# Patient Record
Sex: Female | Born: 1935 | Race: White | Hispanic: No | Smoking: Never smoker
Health system: Southern US, Community
[De-identification: ages and names within clinical notes are randomized; demographics above are authoritative.]

## PROBLEM LIST (undated history)

## (undated) DIAGNOSIS — B029 Zoster without complications: Secondary | ICD-10-CM

## (undated) DIAGNOSIS — I10 Essential (primary) hypertension: Secondary | ICD-10-CM

## (undated) DIAGNOSIS — E7800 Pure hypercholesterolemia, unspecified: Secondary | ICD-10-CM

## (undated) HISTORY — PX: ABDOMINAL HYSTERECTOMY: SHX81

---

## 2004-02-16 ENCOUNTER — Ambulatory Visit: Payer: Self-pay | Admitting: Internal Medicine

## 2005-03-05 ENCOUNTER — Ambulatory Visit: Payer: Self-pay | Admitting: Internal Medicine

## 2006-03-13 ENCOUNTER — Ambulatory Visit: Payer: Self-pay | Admitting: Internal Medicine

## 2007-03-18 ENCOUNTER — Ambulatory Visit: Payer: Self-pay | Admitting: Internal Medicine

## 2008-03-28 ENCOUNTER — Ambulatory Visit: Payer: Self-pay | Admitting: Internal Medicine

## 2009-04-18 ENCOUNTER — Ambulatory Visit: Payer: Self-pay | Admitting: Internal Medicine

## 2010-04-26 ENCOUNTER — Ambulatory Visit: Payer: Self-pay | Admitting: Internal Medicine

## 2011-07-02 ENCOUNTER — Ambulatory Visit: Payer: Self-pay | Admitting: Family Medicine

## 2011-07-04 ENCOUNTER — Ambulatory Visit: Payer: Self-pay | Admitting: Family Medicine

## 2011-10-28 ENCOUNTER — Observation Stay: Payer: Self-pay | Admitting: Internal Medicine

## 2011-10-28 LAB — URINALYSIS, COMPLETE
Bilirubin,UR: NEGATIVE
Blood: NEGATIVE
Glucose,UR: NEGATIVE mg/dL (ref 0–75)
Hyaline Cast: 1
Leukocyte Esterase: NEGATIVE
Nitrite: NEGATIVE
Ph: 7 (ref 4.5–8.0)
RBC,UR: 1 /HPF (ref 0–5)
Specific Gravity: 1.011 (ref 1.003–1.030)
WBC UR: 2 /HPF (ref 0–5)

## 2011-10-28 LAB — COMPREHENSIVE METABOLIC PANEL
Albumin: 3.8 g/dL (ref 3.4–5.0)
Alkaline Phosphatase: 114 U/L (ref 50–136)
Anion Gap: 7 (ref 7–16)
BUN: 18 mg/dL (ref 7–18)
Bilirubin,Total: 0.4 mg/dL (ref 0.2–1.0)
Creatinine: 0.78 mg/dL (ref 0.60–1.30)
EGFR (African American): >60
EGFR (Non-African Amer.): >60
Glucose: 98 mg/dL (ref 65–99)
SGOT(AST): 29 U/L (ref 15–37)

## 2011-10-28 LAB — CBC
HCT: 38.1 % (ref 35.0–47.0)
HGB: 12.7 g/dL (ref 12.0–16.0)
MCHC: 33.4 g/dL (ref 32.0–36.0)
MCV: 98 fL (ref 80–100)
Platelet: 193 10*3/uL (ref 150–440)
RDW: 12.9 % (ref 11.5–14.5)
WBC: 11 10*3/uL (ref 3.6–11.0)

## 2011-10-28 LAB — CK TOTAL AND CKMB (NOT AT ARMC): CK-MB: 1.2 ng/mL (ref 0.5–3.6)

## 2011-10-29 LAB — BASIC METABOLIC PANEL
Anion Gap: 7 (ref 7–16)
BUN: 18 mg/dL (ref 7–18)
Calcium, Total: 8.6 mg/dL (ref 8.5–10.1)
Chloride: 104 mmol/L (ref 98–107)
Creatinine: 0.8 mg/dL (ref 0.60–1.30)
EGFR (African American): >60
Glucose: 108 mg/dL — ABNORMAL HIGH (ref 65–99)
Osmolality: 286 (ref 275–301)
Potassium: 3.7 mmol/L (ref 3.5–5.1)

## 2011-10-29 LAB — CBC WITH DIFFERENTIAL/PLATELET
Basophil %: 1.2 %
Eosinophil %: 1.8 %
HCT: 34.7 % — ABNORMAL LOW (ref 35.0–47.0)
HGB: 11.6 g/dL — ABNORMAL LOW (ref 12.0–16.0)
Lymphocyte #: 1.8 10*3/uL (ref 1.0–3.6)
MCH: 32.6 pg (ref 26.0–34.0)
MCHC: 33.5 g/dL (ref 32.0–36.0)
Monocyte #: 1 x10 3/mm — ABNORMAL HIGH (ref 0.2–0.9)
Neutrophil %: 62.1 %
RDW: 12.5 % (ref 11.5–14.5)

## 2011-10-29 LAB — CK TOTAL AND CKMB (NOT AT ARMC)
CK, Total: 87 U/L (ref 21–215)
CK-MB: 1.6 ng/mL (ref 0.5–3.6)
CK-MB: 1.1 ng/mL (ref 0.5–3.6)

## 2011-10-29 LAB — TROPONIN I: Troponin-I: <0.02 ng/mL

## 2011-10-30 LAB — BASIC METABOLIC PANEL
Anion Gap: 9 (ref 7–16)
BUN: 13 mg/dL (ref 7–18)
Chloride: 103 mmol/L (ref 98–107)
Co2: 30 mmol/L (ref 21–32)
Creatinine: 0.76 mg/dL (ref 0.60–1.30)
EGFR (African American): >60
Glucose: 96 mg/dL (ref 65–99)
Osmolality: 283 (ref 275–301)
Potassium: 3.5 mmol/L (ref 3.5–5.1)
Sodium: 142 mmol/L (ref 136–145)

## 2011-10-30 LAB — CBC WITH DIFFERENTIAL/PLATELET
Eosinophil #: 0.2 10*3/uL (ref 0.0–0.7)
Lymphocyte #: 1.5 10*3/uL (ref 1.0–3.6)
Monocyte %: 10.4 %
Neutrophil #: 4.3 10*3/uL (ref 1.4–6.5)
Neutrophil %: 64.1 %
Platelet: 166 10*3/uL (ref 150–440)
RBC: 3.64 10*6/uL — ABNORMAL LOW (ref 3.80–5.20)
RDW: 12.7 % (ref 11.5–14.5)
WBC: 6.7 10*3/uL (ref 3.6–11.0)

## 2012-01-02 ENCOUNTER — Ambulatory Visit: Payer: Self-pay | Admitting: Family Medicine

## 2013-01-12 ENCOUNTER — Ambulatory Visit: Payer: Self-pay | Admitting: Family Medicine

## 2013-02-01 ENCOUNTER — Ambulatory Visit: Payer: Self-pay | Admitting: Unknown Physician Specialty

## 2013-02-01 LAB — BASIC METABOLIC PANEL
Anion Gap: 7 (ref 7–16)
BUN: 16 mg/dL (ref 7–18)
Calcium, Total: 9.4 mg/dL (ref 8.5–10.1)
Co2: 30 mmol/L (ref 21–32)
Creatinine: 0.87 mg/dL (ref 0.60–1.30)
EGFR (Non-African Amer.): >60
Glucose: 96 mg/dL (ref 65–99)

## 2014-02-11 ENCOUNTER — Ambulatory Visit: Payer: Self-pay | Admitting: Family Medicine

## 2014-09-04 NOTE — H&P (Signed)
PATIENT NAME:  [name], Dana Bryant DATE OF BIRTH:  09-Apr-1934  DATE OF ADMISSION:  10/28/2011  CHIEF COMPLAINT: Syncope and pain in the left groin and pelvic area.   HISTORY OF PRESENT ILLNESS: Dana Bryant is a 81 year old female with a history of hypertension, hyperlipidemia, and gastroesophageal reflux disease who was brought to the emergency room by family after she passed out at home. The patient states that she had actually been shopping in [employer] earlier today and tripped and fell and sustained an injury to the left pelvic area. The patient subsequently went home and started to feel nauseous and stated the pain got significantly worse. Her husband helped her to sit down and the patient reportedly passed out. The patient's husband states that she was unresponsive for approximately five minutes and subsequently he called 911. On arrival to the ER, the patient was alert and oriented but continued to have pain in the left groin area and pelvic area with difficulty in flexing her left leg and also weight bearing. A CT scan of the pelvis showed a subtle fracture of the left pubic symphysis that was nondisplaced. There was also a soft tissue density noted in the pelvis adjacent to the bladder and the anterior pelvic wall suggesting hematoma. Bladder appeared to be grossly intact.   PAST MEDICAL HISTORY:  1. Hypertension. 2. Stress incontinence. 3. Irritable bowel syndrome. 4. Gastroesophageal reflux disease.  5. Hyperlipidemia.   PAST SURGICAL HISTORY: 1. Hysterectomy.  2. Bilateral foot surgery in 1970s.   CURRENT MEDICATIONS:  1. Lipitor 20 mg once a day.  2. Atenolol 100 mg twice a day. 3. Diovan/HCT 320/12.5 mg one tablet once a day.  4. Aspirin, enteric-coated, 81 mg once a day.  5. Metoclopramide 10 mg daily p.r.n.  6. Calcium with vitamin D 2 tablets by mouth once a day.  7. Multivitamin 1 tablet a day.  8. Prilosec 20 mg 1 capsule a day.   DRUG ALLERGIES:  Famvir and Sulfa.   FAMILY HISTORY: Father is deceased at age 79 from myocardial infarction. Mother alive with cerebrovascular accident.   SOCIAL HISTORY: The patient is married and lives with her husband. She is retired. She denies any use of tobacco. Occasionally she drinks wine.   REVIEW OF SYSTEMS: The patient denies any chest pain or shortness of breath. No fevers, no chills, no dizziness, no history of seizures, no headaches, and no diarrhea.   PHYSICAL EXAMINATION:   VITAL SIGNS: Temperature 96.8, pulse 64, respirations 18, blood pressure 175/87, and pulse oximetry 98% on room air.   GENERAL: She was in mild distress.   HEENT: Normocephalic, atraumatic. Pupils were equal and reactive to light.   NECK: Supple.   LUNGS: Clear to auscultation.   ABDOMEN: Soft, nontender.   HEART: S1, S2.   EXTREMITIES: Tenderness noted of the pubic symphysis with decreased range of motion of the left hip.  NEUROLOGIC: Alert and oriented x3. No obvious focal weakness.   LABORATORY, DIAGNOSTIC AND RADIOLOGIC DATA: EKG showed sinus rhythm with short PR interval of 92 m/sec. Nonspecific ST-T changes noted in the inferior lateral leads.   WBC count 11, hemoglobin 12.7, hematocrit 38.1, and platelets 193. CK 97 and MB 1.2. Glucose 98, BUN 18, creatinine 0.78, sodium 140, potassium 3.5, chloride 102, CO2 31, calcium 9.1, total protein 7.0, and albumin 3.8. Troponin less than 0.02.   CT of the pelvis showed subtle fracture of the symphysis pubis that was nondisplaced. Soft tissue density noted in the  pelvis adjacent to the bladder and anterior pelvic wall suggesting hematoma. Bladder appeared to be grossly intact.   Pelvic x-ray showed no significant osseous abnormalities.   ASSESSMENT AND PLAN: 1Left Pubic symphysis fracture- Non Displaced.                                                  2 HTN                                                  3 Hyperlipidemia                                                   4 GERD The patient will be admitted to [facility] under observation status We will place her on telemetry. We will rule out myocardial infarction with serial cardiac enzymes. Orthopedics, Dr. Hyacinth Meeker, has been consulted. At this point, Dr. Hyacinth Meeker felt that the patient will be managed conservatively with nonoperative intervention. The patient's pain will be managed with Percocet 5/325 mg one tablet p.o. three times daily p.r.n. and morphine as needed. We will also consult physical therapy and will continue home medications including atenolol, Diovan/HCT, and Prilosec. We will also obtain carotid ultrasound to rule out significant carotid stenosis. ___________________________ Barbette Reichmann, MD vh:slb D: 10/28/2011 19:10:59 ET T: 10/29/2011 07:48:16 ET JOB#: [id number]  cc: Barbette Reichmann, MD, <Dictator> Barbette Reichmann MD ELECTRONICALLY SIGNED 11/21/2011 17:16

## 2014-09-04 NOTE — Discharge Summary (Signed)
PATIENT NAME:  Dana Bryant, [name] MR#:  [id number] DATE OF BIRTH:  April 29, 1934  DATE OF ADMISSION:  10/28/2011 DATE OF DISCHARGE:  10/30/2011  DIAGNOSES AT TIME OF DISCHARGE: 1. Fractured symphysis pubis.  2. Syncope.  3. Hypertension.   CHIEF COMPLAINT: Pain left groin and pelvic area after a fall and syncopal event later on.   HISTORY OF PRESENT ILLNESS: Dana Bryant is a 81 year old female with history of hypertension, hyperlipidemia, gastroesophageal reflux disease who presented to the ER brought by family after she passed out. Patient had been shopping at the [employer] garden [place] and tripped and fell sustaining injury to the left pelvic area, subsequently went home and became nauseous and stated the pain got significantly worse. Patient subsequently states that she passed out and was unresponsive approximately five minutes. CAT scan of the pelvis showed a subtle fracture of the left pubic symphysis that was nondisplaced associated with soft tissue density in the pelvis suggesting hematoma.   PAST MEDICAL HISTORY:  1. Hypertension. 2. Stress incontinence. 3. Irritable bowel syndrome. 4. Gastroesophageal reflux disease. 5. Hyperlipidemia.  6. Hysterectomy.  7. Bilateral foot surgery.   PHYSICAL EXAMINATION: GENERAL: She was in moderate degree of pain. VITAL SIGNS: Temperature 96.8, pulse 64, respirations 18, blood pressure 175/87, oxygen saturation 98% on room air. HEENT: Normocephalic, atraumatic. Pupils equal and reactive to light. NECK: Supple. LUNGS: Clear to auscultation. ABDOMEN: Soft nontender. EXTREMITIES: Tenderness noted in the left groin and pubic symphysis area with decreased range of motion of the left hip. NEUROLOGIC: Nonfocal.   LABORATORY, DIAGNOSTIC AND RADIOLOGICAL DATA: WBC count 11, hemoglobin 12.7, hematocrit 38.1, platelets 193, CK 97, MB 1.2, glucose 98, BUN 18, creatinine 0.78, sodium 140, potassium 3.5, chloride 102, CO2 31, calcium 9.1, total protein 7,  albumin 3.8, troponin less than 0.02.   HOSPITAL COURSE: Patient was seen in consultation by Dr. Hyacinth MeekerMiller, orthopedic surgeon, and recommend symptomatic care with ice, rest and pain medications, physical therapy and walker. Patient during her stay in the hospital had episodes of nausea, vomiting and hypertension. She was also seen by physical therapy. Patient declined to go to rehab and preferred to go home. Home health physical therapy has arranged for her.    DISCHARGE MEDICATIONS: She was discharged in stable condition on the following medications. 1. Metoclopramide 10 mg once a day p.r.n.  2. Prilosec 20 mg once a day.  3. Diovan/HCTZ 325/12.5, 1 tablet a day.  4. Atenolol 100 mg a day.  5. Ibuprofen 800 mg p.o. t.i.d. p.r.n. for pain relief.  6. Calcium carbonate 1250 mg 1 tablet p.o. b.i.d.   DISCHARGE INSTRUCTIONS: Patient has been advised a low-sodium diet. Follow up with Dr. Burnadette PopLinthavong in 1 to 2 weeks' time.  ____________________________ Barbette ReichmannVishwanath Delvecchio Madole, MD vh:cms D: 10/30/2011 12:47:00 ET T: 10/30/2011 13:06:59 ET JOB#: [id number]  cc: Barbette ReichmannVishwanath Argelio Granier, MD, <Dictator> Barbette ReichmannVISHWANATH Randle Shatzer MD ELECTRONICALLY SIGNED 11/21/2011 17:16

## 2014-09-04 NOTE — Consult Note (Signed)
Brief Consult Note: Diagnosis: Left pubic ramus fracture.   Patient was seen by consultant.   Recommend further assessment or treatment.   Orders entered.   Comments: 81 year old female [place] [employer] [place] yesterday injuring the left pelvis. Husband helped her home but she fainted and was brought to Emergency Room where ct scan showed fracture and she was admitted.  No complains of pain other than left pelvis.   Exam:  tender anterior left pubis.  circulation/sensation/motor function good lower extremities.  range of motion of hips and knees intact.  back non tender.  skin intact.    X-rays: ct scan shows hairline fracture left pubis.  Rx:  Symptomatic care-- ice, rest, pain meds.   Physical Therapy with walker.  May need skilled nursing facility.          return to  clinic 3-4 weeks for X-rays.  Electronic Signatures: Valinda HoarMiller, Devondre Guzzetta E (MD)  (Signed 18-Jun-13 11:35)  Authored: Brief Consult Note   Last Updated: 18-Jun-13 11:35 by Valinda HoarMiller, Kimmarie Pascale E (MD)

## 2015-03-28 ENCOUNTER — Other Ambulatory Visit: Payer: Self-pay | Admitting: Family Medicine

## 2015-03-28 DIAGNOSIS — Z1231 Encounter for screening mammogram for malignant neoplasm of breast: Secondary | ICD-10-CM

## 2015-04-19 ENCOUNTER — Other Ambulatory Visit: Payer: Self-pay | Admitting: Family Medicine

## 2015-04-19 ENCOUNTER — Ambulatory Visit
Admission: RE | Admit: 2015-04-19 | Discharge: 2015-04-19 | Disposition: A | Discharge status: Home / Self Care | Payer: Medicare PPO | Source: Ambulatory Visit | Attending: Family Medicine | Admitting: Family Medicine

## 2015-04-19 DIAGNOSIS — Z1231 Encounter for screening mammogram for malignant neoplasm of breast: Secondary | ICD-10-CM | POA: Diagnosis present

## 2016-03-12 ENCOUNTER — Other Ambulatory Visit: Payer: Self-pay | Admitting: Family Medicine

## 2016-03-12 DIAGNOSIS — Z1231 Encounter for screening mammogram for malignant neoplasm of breast: Secondary | ICD-10-CM

## 2016-04-23 ENCOUNTER — Ambulatory Visit
Admission: RE | Admit: 2016-04-23 | Discharge: 2016-04-23 | Disposition: A | Discharge status: Home / Self Care | Payer: Medicare PPO | Source: Ambulatory Visit | Attending: Family Medicine | Admitting: Family Medicine

## 2016-04-23 DIAGNOSIS — Z1231 Encounter for screening mammogram for malignant neoplasm of breast: Secondary | ICD-10-CM | POA: Diagnosis present

## 2017-03-19 ENCOUNTER — Other Ambulatory Visit: Payer: Self-pay | Admitting: Family Medicine

## 2017-03-19 DIAGNOSIS — Z1231 Encounter for screening mammogram for malignant neoplasm of breast: Secondary | ICD-10-CM

## 2017-04-28 ENCOUNTER — Ambulatory Visit
Admission: RE | Admit: 2017-04-28 | Discharge: 2017-04-28 | Disposition: A | Discharge status: Home / Self Care | Payer: Medicare PPO | Source: Ambulatory Visit | Attending: Family Medicine | Admitting: Family Medicine

## 2017-04-28 DIAGNOSIS — R928 Other abnormal and inconclusive findings on diagnostic imaging of breast: Secondary | ICD-10-CM | POA: Diagnosis not present

## 2017-04-28 DIAGNOSIS — Z1231 Encounter for screening mammogram for malignant neoplasm of breast: Secondary | ICD-10-CM | POA: Diagnosis not present

## 2017-04-28 DIAGNOSIS — N6489 Other specified disorders of breast: Secondary | ICD-10-CM | POA: Diagnosis not present

## 2017-04-30 ENCOUNTER — Other Ambulatory Visit: Payer: Self-pay | Admitting: Family Medicine

## 2017-04-30 DIAGNOSIS — R928 Other abnormal and inconclusive findings on diagnostic imaging of breast: Secondary | ICD-10-CM

## 2017-04-30 DIAGNOSIS — N6489 Other specified disorders of breast: Secondary | ICD-10-CM

## 2017-05-15 ENCOUNTER — Ambulatory Visit
Admission: RE | Admit: 2017-05-15 | Discharge: 2017-05-15 | Disposition: A | Discharge status: Home / Self Care | Payer: Medicare PPO | Source: Ambulatory Visit | Attending: Family Medicine | Admitting: Family Medicine

## 2017-05-15 DIAGNOSIS — N6489 Other specified disorders of breast: Secondary | ICD-10-CM | POA: Diagnosis present

## 2017-05-15 DIAGNOSIS — R928 Other abnormal and inconclusive findings on diagnostic imaging of breast: Secondary | ICD-10-CM

## 2018-05-04 ENCOUNTER — Other Ambulatory Visit: Payer: Self-pay | Admitting: Family Medicine

## 2018-05-04 DIAGNOSIS — Z1231 Encounter for screening mammogram for malignant neoplasm of breast: Secondary | ICD-10-CM

## 2018-06-02 ENCOUNTER — Ambulatory Visit
Admission: RE | Admit: 2018-06-02 | Discharge: 2018-06-02 | Disposition: A | Discharge status: Home / Self Care | Payer: Medicare PPO | Source: Ambulatory Visit | Attending: Family Medicine | Admitting: Family Medicine

## 2018-06-02 DIAGNOSIS — Z1231 Encounter for screening mammogram for malignant neoplasm of breast: Secondary | ICD-10-CM | POA: Diagnosis not present

## 2019-05-20 ENCOUNTER — Other Ambulatory Visit: Payer: Self-pay | Admitting: Family Medicine

## 2019-05-20 DIAGNOSIS — Z1231 Encounter for screening mammogram for malignant neoplasm of breast: Secondary | ICD-10-CM

## 2019-06-29 ENCOUNTER — Ambulatory Visit
Admission: RE | Admit: 2019-06-29 | Discharge: 2019-06-29 | Disposition: A | Discharge status: Home / Self Care | Payer: Medicare HMO | Source: Ambulatory Visit | Attending: Family Medicine | Admitting: Family Medicine

## 2019-06-29 DIAGNOSIS — Z1231 Encounter for screening mammogram for malignant neoplasm of breast: Secondary | ICD-10-CM | POA: Diagnosis present

## 2019-10-11 ENCOUNTER — Inpatient Hospital Stay
Admission: EM | Admit: 2019-10-11 | Discharge: 2019-10-15 | DRG: 641 | Disposition: A | Discharge status: Skilled Nursing Facility | Payer: Medicare HMO | Attending: Internal Medicine | Admitting: Internal Medicine

## 2019-10-11 ENCOUNTER — Other Ambulatory Visit: Payer: Self-pay

## 2019-10-11 ENCOUNTER — Emergency Department: Payer: Medicare HMO

## 2019-10-11 ENCOUNTER — Encounter: Payer: Self-pay | Admitting: Emergency Medicine

## 2019-10-11 DIAGNOSIS — E871 Hypo-osmolality and hyponatremia: Principal | ICD-10-CM | POA: Diagnosis present

## 2019-10-11 DIAGNOSIS — F419 Anxiety disorder, unspecified: Secondary | ICD-10-CM | POA: Diagnosis present

## 2019-10-11 DIAGNOSIS — E7800 Pure hypercholesterolemia, unspecified: Secondary | ICD-10-CM | POA: Diagnosis present

## 2019-10-11 DIAGNOSIS — Z20822 Contact with and (suspected) exposure to covid-19: Secondary | ICD-10-CM | POA: Diagnosis present

## 2019-10-11 DIAGNOSIS — I10 Essential (primary) hypertension: Secondary | ICD-10-CM | POA: Diagnosis present

## 2019-10-11 DIAGNOSIS — R001 Bradycardia, unspecified: Secondary | ICD-10-CM | POA: Diagnosis present

## 2019-10-11 DIAGNOSIS — E785 Hyperlipidemia, unspecified: Secondary | ICD-10-CM | POA: Diagnosis present

## 2019-10-11 DIAGNOSIS — Z888 Allergy status to other drugs, medicaments and biological substances status: Secondary | ICD-10-CM | POA: Diagnosis not present

## 2019-10-11 DIAGNOSIS — T502X5A Adverse effect of carbonic-anhydrase inhibitors, benzothiadiazides and other diuretics, initial encounter: Secondary | ICD-10-CM | POA: Diagnosis present

## 2019-10-11 DIAGNOSIS — Z882 Allergy status to sulfonamides status: Secondary | ICD-10-CM

## 2019-10-11 DIAGNOSIS — R531 Weakness: Secondary | ICD-10-CM

## 2019-10-11 HISTORY — DX: Essential (primary) hypertension: I10

## 2019-10-11 HISTORY — DX: Zoster without complications: B02.9

## 2019-10-11 HISTORY — DX: Pure hypercholesterolemia, unspecified: E78.00

## 2019-10-11 LAB — OSMOLALITY, URINE: Osmolality, Ur: 364 mOsm/kg (ref 300–900)

## 2019-10-11 LAB — URINALYSIS, COMPLETE (UACMP) WITH MICROSCOPIC
Bacteria, UA: NONE SEEN
Bilirubin Urine: NEGATIVE
Glucose, UA: NEGATIVE mg/dL
Hgb urine dipstick: NEGATIVE
Ketones, ur: NEGATIVE mg/dL
Nitrite: NEGATIVE
Protein, ur: NEGATIVE mg/dL
Specific Gravity, Urine: 1.013 (ref 1.005–1.030)
pH: 7 (ref 5.0–8.0)

## 2019-10-11 LAB — COMPREHENSIVE METABOLIC PANEL
ALT: 22 U/L (ref 0–44)
AST: 30 U/L (ref 15–41)
Albumin: 4 g/dL (ref 3.5–5.0)
Alkaline Phosphatase: 64 U/L (ref 38–126)
Anion gap: 12 (ref 5–15)
BUN: 24 mg/dL — ABNORMAL HIGH (ref 8–23)
CO2: 27 mmol/L (ref 22–32)
Calcium: 9.5 mg/dL (ref 8.9–10.3)
Chloride: 77 mmol/L — ABNORMAL LOW (ref 98–111)
Creatinine, Ser: 0.65 mg/dL (ref 0.44–1.00)
GFR calc Af Amer: >60 mL/min (ref >60)
GFR calc non Af Amer: >60 mL/min (ref >60)
Glucose, Bld: 117 mg/dL — ABNORMAL HIGH (ref 70–99)
Potassium: 3.8 mmol/L (ref 3.5–5.1)
Sodium: 116 mmol/L — CL (ref 135–145)
Total Bilirubin: 1 mg/dL (ref 0.3–1.2)
Total Protein: 7 g/dL (ref 6.5–8.1)

## 2019-10-11 LAB — CORTISOL: Cortisol, Plasma: 21 ug/dL

## 2019-10-11 LAB — OSMOLALITY: Osmolality: 245 mOsm/kg — CL (ref 275–295)

## 2019-10-11 LAB — TSH: TSH: 4.193 u[IU]/mL (ref 0.350–4.500)

## 2019-10-11 LAB — TROPONIN I (HIGH SENSITIVITY)
Troponin I (High Sensitivity): 8 ng/L (ref <18)
Troponin I (High Sensitivity): 9 ng/L (ref <18)

## 2019-10-11 LAB — CBC
HCT: UNDETERMINED % (ref 36.0–46.0)
Hemoglobin: 13.9 g/dL (ref 12.0–15.0)
MCH: UNDETERMINED pg (ref 26.0–34.0)
MCHC: UNDETERMINED g/dL (ref 30.0–36.0)
MCV: UNDETERMINED fL (ref 80.0–100.0)
Platelets: 240 10*3/uL (ref 150–400)
RBC: 4.27 MIL/uL (ref 3.87–5.11)
RDW: 11.5 % (ref 11.5–15.5)
WBC: 8.3 10*3/uL (ref 4.0–10.5)
nRBC: 0 % (ref 0.0–0.2)

## 2019-10-11 LAB — SARS CORONAVIRUS 2 BY RT PCR (HOSPITAL ORDER, PERFORMED IN ~~LOC~~ HOSPITAL LAB): SARS Coronavirus 2: NEGATIVE

## 2019-10-11 LAB — URIC ACID: Uric Acid, Serum: 4.7 mg/dL (ref 2.5–7.1)

## 2019-10-11 LAB — SODIUM, URINE, RANDOM: Sodium, Ur: 21 mmol/L

## 2019-10-11 LAB — CREATININE, URINE, RANDOM: Creatinine, Urine: 88 mg/dL

## 2019-10-11 MED ORDER — ONDANSETRON HCL 4 MG/2ML IJ SOLN: 4.0000 mg | Freq: Four times a day (QID) | INTRAMUSCULAR | Status: DC | PRN

## 2019-10-11 MED ORDER — SODIUM CHLORIDE 0.9 % IV SOLN: INTRAVENOUS | Status: DC

## 2019-10-11 MED ORDER — ONDANSETRON HCL 4 MG PO TABS: 4.0000 mg | ORAL_TABLET | Freq: Four times a day (QID) | ORAL | Status: DC | PRN

## 2019-10-11 MED ORDER — SODIUM CHLORIDE 0.9% FLUSH
3.0000 mL | Freq: Two times a day (BID) | INTRAVENOUS | Status: DC
  Administered 2019-10-11 – 2019-10-12 (×3): 3 mL via INTRAVENOUS

## 2019-10-11 MED ORDER — CALCIUM CARBONATE-VITAMIN D 500-200 MG-UNIT PO TABS
1.0000 | ORAL_TABLET | Freq: Two times a day (BID) | ORAL | Status: DC
  Administered 2019-10-11 – 2019-10-15 (×8): 1 via ORAL
  Filled 2019-10-11 (×8): qty 1

## 2019-10-11 MED ORDER — SODIUM CHLORIDE 0.9 % IV SOLN: 250.0000 mL | INTRAVENOUS | Status: DC | PRN

## 2019-10-11 MED ORDER — ASPIRIN EC 81 MG PO TBEC
81.0000 mg | DELAYED_RELEASE_TABLET | Freq: Every day | ORAL | Status: DC
  Administered 2019-10-11 – 2019-10-15 (×5): 81 mg via ORAL
  Filled 2019-10-11 (×5): qty 1

## 2019-10-11 MED ORDER — ACETAMINOPHEN 650 MG RE SUPP: 650.0000 mg | Freq: Four times a day (QID) | RECTAL | Status: DC | PRN

## 2019-10-11 MED ORDER — PANTOPRAZOLE SODIUM 40 MG PO TBEC
40.0000 mg | DELAYED_RELEASE_TABLET | Freq: Every day | ORAL | Status: DC
  Administered 2019-10-12 – 2019-10-15 (×4): 40 mg via ORAL
  Filled 2019-10-11 (×4): qty 1

## 2019-10-11 MED ORDER — ENOXAPARIN SODIUM 40 MG/0.4ML ~~LOC~~ SOLN
40.0000 mg | SUBCUTANEOUS | Status: DC
  Administered 2019-10-11 – 2019-10-14 (×4): 40 mg via SUBCUTANEOUS
  Filled 2019-10-11 (×4): qty 0.4

## 2019-10-11 MED ORDER — AMLODIPINE BESYLATE 5 MG PO TABS
5.0000 mg | ORAL_TABLET | Freq: Every day | ORAL | Status: DC
  Administered 2019-10-12 – 2019-10-15 (×4): 5 mg via ORAL
  Filled 2019-10-11 (×4): qty 1

## 2019-10-11 MED ORDER — ESCITALOPRAM OXALATE 10 MG PO TABS
5.0000 mg | ORAL_TABLET | Freq: Every day | ORAL | Status: DC
  Administered 2019-10-12: 5 mg via ORAL
  Filled 2019-10-11: qty 0.5

## 2019-10-11 MED ORDER — ATORVASTATIN CALCIUM 20 MG PO TABS
20.0000 mg | ORAL_TABLET | Freq: Every day | ORAL | Status: DC
  Administered 2019-10-11 – 2019-10-15 (×5): 20 mg via ORAL
  Filled 2019-10-11 (×5): qty 1

## 2019-10-11 MED ORDER — SODIUM CHLORIDE 0.9% FLUSH: 3.0000 mL | INTRAVENOUS | Status: DC | PRN

## 2019-10-11 MED ORDER — ACETAMINOPHEN 325 MG PO TABS: 650.0000 mg | ORAL_TABLET | Freq: Four times a day (QID) | ORAL | Status: DC | PRN

## 2019-10-11 MED ORDER — SODIUM CHLORIDE 0.9% FLUSH: 3.0000 mL | Freq: Once | INTRAVENOUS | Status: DC

## 2019-10-11 NOTE — ED Provider Notes (Signed)
[facility] Emergency Department Provider Note   ____________________________________________   First MD Initiated Contact with Patient 10/11/19 1215     (approximate)  I have reviewed the triage vital signs and the nursing notes.   HISTORY  Chief Complaint Weakness    HPI Dana Bryant is a 86 y.o. female with past with history of hypertension and hyperlipidemia who presents to the ED for generalized weakness.  Patient reports that she has been feeling increasingly weak over the past 4 days, typically can ambulate without difficulty but has had to require a walker recently.  She has had associated decreased appetite, fatigue, and decreased urination despite drinking plenty of fluids.  She denies any fevers, chills, chest pain, shortness of breath, dysuria, or hematuria.  She did recently start S-Citalopram for anxiety and symptoms seem to come on shortly after that.        Past Medical History:  Diagnosis Date  . High cholesterol   . Hypertension   . Shingles     There are no problems to display for this patient.   Past Surgical History:  Procedure Laterality Date  . ABDOMINAL HYSTERECTOMY      Prior to Admission medications   Not on File    Allergies Famvir [famciclovir] and Sulfa antibiotics  Family History  Problem Relation Age of Onset  . Ovarian cancer Sister 24  . Breast cancer Neg Hx     Social History Social History   Tobacco Use  . Smoking status: Never Smoker  . Smokeless tobacco: Never Used  Substance Use Topics  . Alcohol use: Not Currently  . Drug use: Not Currently    Review of Systems  Constitutional: No fever/chills.  Positive for fatigue and generalized weakness. Eyes: No visual changes. ENT: No sore throat. Cardiovascular: Denies chest pain. Respiratory: Denies shortness of breath. Gastrointestinal: No abdominal pain.  No nausea, no vomiting.  No diarrhea.  No constipation. Genitourinary:  Negative for dysuria. Musculoskeletal: Negative for back pain. Skin: Negative for rash. Neurological: Negative for headaches, focal weakness or numbness.  ____________________________________________   PHYSICAL EXAM:  VITAL SIGNS: ED Triage Vitals  Enc Vitals Group     BP 10/11/19 1150 (!) 154/56     Pulse Rate 10/11/19 1150 (!) 57     Resp 10/11/19 1150 14     Temp 10/11/19 1150 98.1 F (36.7 C)     Temp Source 10/11/19 1150 Oral     SpO2 10/11/19 1150 95 %     Weight 10/11/19 1154 135 lb (61.2 kg)     Height 10/11/19 1154 5\' 1"  (1.549 m)     Head Circumference --      Peak Flow --      Pain Score 10/11/19 1154 0     Pain Loc --      Pain Edu? --      Excl. in GC? --     Constitutional: Alert and oriented. Eyes: Conjunctivae are normal. Head: Atraumatic. Nose: No congestion/rhinnorhea. Mouth/Throat: Mucous membranes are moist. Neck: Normal ROM Cardiovascular: Normal rate, regular rhythm. Grossly normal heart sounds. Respiratory: Normal respiratory effort.  No retractions. Lungs CTAB. Gastrointestinal: Soft and nontender. No distention. Genitourinary: deferred Musculoskeletal: No lower extremity tenderness nor edema. Neurologic:  Normal speech and language. No gross focal neurologic deficits are appreciated. Skin:  Skin is warm, dry and intact. No rash noted. Psychiatric: Mood and affect are normal. Speech and behavior are normal.  ____________________________________________   LABS (all labs ordered are  listed, but only abnormal results are displayed)  Labs Reviewed  URINALYSIS, COMPLETE (UACMP) WITH MICROSCOPIC - Abnormal; Notable for the following components:      Result Value   Color, Urine YELLOW (*)    APPearance CLOUDY (*)    Leukocytes,Ua SMALL (*)    All other components within normal limits  COMPREHENSIVE METABOLIC PANEL - Abnormal; Notable for the following components:   Sodium 116 (*)    Chloride 77 (*)    Glucose, Bld 117 (*)    BUN 24 (*)     All other components within normal limits  SARS CORONAVIRUS 2 BY RT PCR (HOSPITAL ORDER, [place] [facility])  CBC  CBG MONITORING, ED  TROPONIN I (HIGH SENSITIVITY)  TROPONIN I (HIGH SENSITIVITY)   ____________________________________________  EKG  ED ECG REPORT I, Blake Divine, the attending physician, personally viewed and interpreted this ECG.   Date: 10/11/2019  EKG Time: 11:48  Rate: 58  Rhythm: sinus bradycardia  Axis: Normal  Intervals:Short PR  ST&T Change: T wave changes inferiorly   PROCEDURES  Procedure(s) performed (including Critical Care):  .Critical Care Performed by: Blake Divine, MD Authorized by: Blake Divine, MD   Critical care provider statement:    Critical care time (minutes):  45   Critical care time was exclusive of:  Separately billable procedures and treating other patients and teaching time   Critical care was necessary to treat or prevent imminent or life-threatening deterioration of the following conditions:  Metabolic crisis   Critical care was time spent personally by me on the following activities:  Discussions with consultants, evaluation of patient's response to treatment, examination of patient, ordering and performing treatments and interventions, ordering and review of laboratory studies, ordering and review of radiographic studies, pulse oximetry, re-evaluation of patient's condition, obtaining history from patient or surrogate and review of old charts   I assumed direction of critical care for this patient from another provider in my specialty: no   .1-3 Lead EKG Interpretation Performed by: Blake Divine, MD Authorized by: Blake Divine, MD     Interpretation: abnormal     ECG rate:  58   ECG rate assessment: bradycardic     Rhythm: sinus bradycardia     Ectopy: none     Conduction: normal       ____________________________________________   INITIAL IMPRESSION / ASSESSMENT AND PLAN / ED  COURSE       86 year old female with history of hypertension and hyperlipidemia presents to the ED complaining of increasing generalized weakness over the past 3 to 4 days.  She is ANO x4 and at her neurologic baseline with no focal deficits.  EKG shows sinus bradycardia with T wave changes similar to prior.  There are no apparent infectious symptoms.  Work-up thus far is remarkable for hyponatremia with sodium level of 116.  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  Nephrology recommends starting 1 L fluid restriction for now, will discuss with hospitalist for admission.      ____________________________________________   FINAL CLINICAL IMPRESSION(S) / ED DIAGNOSES  Final diagnoses:  Hyponatremia  Generalized weakness     ED Discharge Orders    None       Note:  This document was prepared using Dragon voice recognition software and may include unintentional dictation errors.   Blake Divine, MD 10/11/19 [id number]

## 2019-10-11 NOTE — H&P (Addendum)
History and Physical    Dana Bryant FGH:[id number] DOB: 01-07-1934 DOA: 10/11/2019  PCP: Dion Body, MD   Patient coming from: Home  I have personally briefly reviewed patient's old medical records in [place]  Chief Complaint: Generalized weakness  HPI: Dana Bryant is a 86 y.o. female with medical history significant for hypertension, depression and dyslipidemia who presents to the emergency room for evaluation of generalized weakness.  Symptoms have been ongoing for about 3 weeks when patient stated that she just felt weak and washed out but was still able to carry out her activities of daily living.  Over the last 4 days she has gotten increasingly weak and required the use of a walker for ambulation.  Patient states she fell 2 days prior to coming to the hospital just because she was very weak.  She has had poor oral intake but denies having any nausea, vomiting, dizziness, lightheadedness or diaphoresis.  She denies having any fever, no chills, no chest pain, no shortness of breath or urinary symptoms. Patient states that she had recent changes toher medications, she had a new antihypertensive medication added and was recently started on citalopram for anxiety symptoms. Labs reveal a serum sodium of 116. Twelve-lead EKG shows sinus bradycardia  ED Course: Patient presents to the emergency room for evaluation of generalized weakness and found to have a serum sodium of 116.  She is also bradycardic.  She will be admitted to the hospital for further evaluation.  Review of Systems: As per HPI otherwise 10 point review of systems negative.    Past Medical History:  Diagnosis Date  . High cholesterol   . Hypertension   . Shingles     Past Surgical History:  Procedure Laterality Date  . ABDOMINAL HYSTERECTOMY       reports that she has never smoked. She has never used smokeless tobacco. She reports previous alcohol use. She reports previous drug  use.  Allergies  Allergen Reactions  . Famvir [Famciclovir] Rash  . Sulfa Antibiotics Swelling and Rash    Family History  Problem Relation Age of Onset  . Ovarian cancer Sister 80  . Breast cancer Neg Hx      Prior to Admission medications   Not on File    Physical Exam: Vitals:   10/11/19 1314 10/11/19 1402 10/11/19 1405 10/11/19 1430  BP:  (!) 182/74    Pulse: 63 62 62 62  Resp:   17 14  Temp:      TempSrc:      SpO2: 98% 95%  97%  Weight:      Height:         Vitals:   10/11/19 1314 10/11/19 1402 10/11/19 1405 10/11/19 1430  BP:  (!) 182/74    Pulse: 63 62 62 62  Resp:   17 14  Temp:      TempSrc:      SpO2: 98% 95%  97%  Weight:      Height:        Constitutional: NAD, alert and oriented x 3 Eyes: PERRL, lids and conjunctivae normal ENMT: Mucous membranes are moist.  Neck: normal, supple, no masses, no thyromegaly Respiratory: clear to auscultation bilaterally, no wheezing, no crackles. Normal respiratory effort. No accessory muscle use.  Cardiovascular: Bradycardia, no murmurs / rubs / gallops. No extremity edema. 2+ pedal pulses. No carotid bruits.  Abdomen: no tenderness, no masses palpated. No hepatosplenomegaly. Bowel sounds positive.  Musculoskeletal: no clubbing / cyanosis. No  joint deformity upper and lower extremities.  Skin: no rashes, lesions, ulcers.  Neurologic: No gross focal neurologic deficit, weakness letter hello this patient's fall Psychiatric: Normal mood and affect.   Labs on Admission: I have personally reviewed following labs and imaging studies  CBC: Recent Labs  Lab 10/11/19 1200  WBC 8.3  HGB 13.9  HCT Unable to determine due to a cold agglutinin  MCV Unable to determine due to a cold agglutinin  PLT 240   Basic Metabolic Panel: Recent Labs  Lab 10/11/19 1200  NA 116*  K 3.8  CL 77*  CO2 27  GLUCOSE 117*  BUN 24*  CREATININE 0.65  CALCIUM 9.5   GFR: Estimated Creatinine Clearance: 44.7 mL/min (by C-G  formula based on SCr of 0.65 mg/dL). Liver Function Tests: Recent Labs  Lab 10/11/19 1200  AST 30  ALT 22  ALKPHOS 64  BILITOT 1.0  PROT 7.0  ALBUMIN 4.0   No results for input(s): LIPASE, AMYLASE in the last 168 hours. No results for input(s): AMMONIA in the last 168 hours. Coagulation Profile: No results for input(s): INR, PROTIME in the last 168 hours. Cardiac Enzymes: No results for input(s): CKTOTAL, CKMB, CKMBINDEX, TROPONINI in the last 168 hours. BNP (last 3 results) No results for input(s): PROBNP in the last 8760 hours. HbA1C: No results for input(s): HGBA1C in the last 72 hours. CBG: No results for input(s): GLUCAP in the last 168 hours. Lipid Profile: No results for input(s): CHOL, HDL, LDLCALC, TRIG, CHOLHDL, LDLDIRECT in the last 72 hours. Thyroid Function Tests: No results for input(s): TSH, T4TOTAL, FREET4, T3FREE, THYROIDAB in the last 72 hours. Anemia Panel: No results for input(s): VITAMINB12, FOLATE, FERRITIN, TIBC, IRON, RETICCTPCT in the last 72 hours. Urine analysis:    Component Value Date/Time   COLORURINE YELLOW (A) 10/11/2019 1200   APPEARANCEUR CLOUDY (A) 10/11/2019 1200   APPEARANCEUR Hazy 10/28/2011 1657   LABSPEC 1.013 10/11/2019 1200   LABSPEC 1.011 10/28/2011 1657   PHURINE 7.0 10/11/2019 1200   GLUCOSEU NEGATIVE 10/11/2019 1200   GLUCOSEU Negative 10/28/2011 1657   HGBUR NEGATIVE 10/11/2019 1200   BILIRUBINUR NEGATIVE 10/11/2019 1200   BILIRUBINUR Negative 10/28/2011 1657   KETONESUR NEGATIVE 10/11/2019 1200   PROTEINUR NEGATIVE 10/11/2019 1200   NITRITE NEGATIVE 10/11/2019 1200   LEUKOCYTESUR SMALL (A) 10/11/2019 1200   LEUKOCYTESUR Negative 10/28/2011 1657    Radiological Exams on Admission: DG Chest Portable 1 View  Result Date: 10/11/2019 CLINICAL DATA:  Weakness EXAM: PORTABLE CHEST 1 VIEW COMPARISON:  None. FINDINGS: Lungs are clear. Heart size and pulmonary vascularity are normal. No adenopathy. There is aortic  atherosclerosis. No bone lesions. There is calcification in the left carotid artery. IMPRESSION: Lungs clear.  Cardiac silhouette normal. Aortic Atherosclerosis (ICD10-I70.0). There is also left carotid artery calcification. Electronically Signed   By: Bretta Bang III M.D.   On: 10/11/2019 13:30    EKG: Independently reviewed.  Sinus bradycardia  Assessment/Plan Principal Problem:   Hyponatremia Active Problems:   Bradycardia   Essential hypertension     Hyponatremia Most likely related to HCTZ use with concomitant administration of SSRI Patient was started on Celexa couple of days ago Will start patient on gentle IVF hydration Obtain urine sodium levels as well as serum osmolality Request nephrology consult Repeat electrolytes in a.m.    Hypertension Hold HCTZ and Atenolol for now due to bradycardia and hyponatremia Continue Amlodipine and Losartan Place patient on hydralazine 10 mg IV for systolic blood pressure greater than     Generalized weakness Appears to be multifactorial and may be related to hyponatremia as well as bradycardia Obtain TSH levels We will request PT evaluation once patient's electrolytes have improved  DVT prophylaxis: Lovenox Code Status: Full Family Communication: Greater than 50% of time was spent discussing plan of care with patient and her son at the bedside, they verbalize understanding and agree with the plan. Code status was discussed and patient is a Full code Disposition Plan: Back to previous home environment Consults called: Nephrology    Lucile Shutters MD [facility]     10/11/2019, 3:19 PM

## 2019-10-11 NOTE — ED Notes (Addendum)
EDP [place] notified of critical sodium of 116 per lab.

## 2019-10-11 NOTE — Consult Note (Signed)
[facility] CONSULT NOTE    Date: 10/11/2019                  Patient Name:  Dana Bryant  MRN: [id number]  DOB: Sep 30, 1933  Age / Sex: 86 y.o., female         PCP: Marisue Ivan, MD                 Service Requesting Consult:  Hospitalist                 Reason for Consult:  Hyponatremia            History of Present Illness: Patient is a 86 y.o. female with a PMHx of hyperlipidemia, hypertension, prior history of herpes zoster infection, who was admitted to [facility] on 10/11/2019 for evaluation of generalized weakness.  She reports that her weakness has been rather progressive in nature.  1 week ago she reports that she started taking Escitalopram.  Her serum sodium now found to be 116.  However in addition she was on Diovan/HCTZ which is known to cause hyponatremia as well.  Her most recent outpatient serum sodium was 142 back in February.  She also reports that she has been drinking a significant amount of water recently.  Her son is also at the bedside today.   Medications: Outpatient medications: (Not in a hospital admission)   Current medications: Current Facility-Administered Medications  Medication Dose Route Frequency Provider Last Rate Last Admin  . 0.9 %  sodium chloride infusion  250 mL Intravenous PRN Agbata, Tochukwu, MD      . 0.9 %  sodium chloride infusion   Intravenous Continuous Reneisha Stilley, MD      . acetaminophen (TYLENOL) tablet 650 mg  650 mg Oral Q6H PRN Agbata, Tochukwu, MD       Or  . acetaminophen (TYLENOL) suppository 650 mg  650 mg Rectal Q6H PRN Agbata, Tochukwu, MD      . enoxaparin (LOVENOX) injection 40 mg  40 mg Subcutaneous Q24H Agbata, Tochukwu, MD      . ondansetron (ZOFRAN) tablet 4 mg  4 mg Oral Q6H PRN Agbata, Tochukwu, MD       Or  . ondansetron (ZOFRAN) injection 4 mg  4 mg Intravenous Q6H PRN Agbata, Tochukwu, MD      . sodium chloride flush (NS) 0.9 % injection 3 mL  3 mL Intravenous Once Willy Eddy, MD      . sodium chloride flush (NS) 0.9 % injection 3 mL  3 mL Intravenous Q12H Agbata, Tochukwu, MD      . sodium chloride flush (NS) 0.9 % injection 3 mL  3 mL Intravenous PRN Agbata, Tochukwu, MD       Current Outpatient Medications  Medication Sig Dispense Refill  . amLODipine (NORVASC) 5 MG tablet Take 5 mg by mouth daily.    Marland Kitchen aspirin 81 MG EC tablet Take 81 mg by mouth daily at 12 noon.    Marland Kitchen atenolol (TENORMIN) 100 MG tablet Take 100 mg by mouth daily.    Marland Kitchen atorvastatin (LIPITOR) 20 MG tablet Take 20 mg by mouth daily.    . Calcium Carbonate-Vitamin D 600-400 MG-UNIT tablet Take 1 tablet by mouth 2 (two) times daily.     Marland Kitchen escitalopram (LEXAPRO) 5 MG tablet Take 5 mg by mouth daily.    Marland Kitchen omeprazole (PRILOSEC) 20 MG capsule Take 20 mg by mouth daily.    . valsartan-hydrochlorothiazide (DIOVAN-HCT) 320-25 MG  tablet Take 1 tablet by mouth daily.    . metoCLOPramide (REGLAN) 10 MG tablet Take 10 mg by mouth daily.        Allergies: Allergies  Allergen Reactions  . Famciclovir Rash  . Sulfa Antibiotics Swelling and Rash      Past Medical History: Past Medical History:  Diagnosis Date  . High cholesterol   . Hypertension   . Shingles      Past Surgical History: Past Surgical History:  Procedure Laterality Date  . ABDOMINAL HYSTERECTOMY       Family History: Family History  Problem Relation Age of Onset  . Ovarian cancer Sister 29  . Breast cancer Neg Hx      Social History: Social History   Socioeconomic History  . Marital status: Widowed    Spouse name: Not on file  . Number of children: Not on file  . Years of education: Not on file  . Highest education level: Not on file  Occupational History  . Not on file  Tobacco Use  . Smoking status: Never Smoker  . Smokeless tobacco: Never Used  Substance and Sexual Activity  . Alcohol use: Not Currently  . Drug use: Not Currently  . Sexual activity: Not on file  Other Topics Concern  . Not on  file  Social History Narrative  . Not on file   Social Determinants of Health   Financial Resource Strain:   . Difficulty of Paying Living Expenses:   Food Insecurity:   . Worried About Charity fundraiser in the Last Year:   . Arboriculturist in the Last Year:   Transportation Needs:   . Film/video editor (Medical):   Marland Kitchen Lack of Transportation (Non-Medical):   Physical Activity:   . Days of Exercise per Week:   . Minutes of Exercise per Session:   Stress:   . Feeling of Stress :   Social Connections:   . Frequency of Communication with Friends and Family:   . Frequency of Social Gatherings with Friends and Family:   . Attends Religious Services:   . Active Member of Clubs or Organizations:   . Attends Archivist Meetings:   Marland Kitchen Marital Status:   Intimate Partner Violence:   . Fear of Current or Ex-Partner:   . Emotionally Abused:   Marland Kitchen Physically Abused:   . Sexually Abused:      Review of Systems: As per HPI  Vital Signs: Blood pressure (!) 182/74, pulse 62, temperature 98.1 F (36.7 C), temperature source Oral, resp. rate 14, height 5\' 1"  (1.549 m), weight 61.2 kg, SpO2 97 %.  Weight trends: Filed Weights   10/11/19 1154  Weight: 61.2 kg    Physical Exam: General: NAD, laying in bed  Head: Normocephalic, atraumatic.  Eyes: Anicteric, EOMI  Nose: Mucous membranes moist, not inflammed, nonerythematous.  Throat: Oropharynx nonerythematous, no exudate appreciated.   Neck: Supple, trachea midline.  Lungs:  Normal respiratory effort. Clear to auscultation BL without crackles or wheezes.  Heart: RRR. S1 and S2 normal without gallop, murmur, or rubs.  Abdomen:  BS normoactive. Soft, Nondistended, non-tender.  No masses or organomegaly.  Extremities: No pretibial edema.  Neurologic: A&O X3, Motor strength is 5/5 in the all 4 extremities  Skin: No visible rashes, scars.    Lab results: Basic Metabolic Panel: Recent Labs  Lab 10/11/19 1200  NA 116*   K 3.8  CL 77*  CO2 27  GLUCOSE 117*  BUN 24*  CREATININE 0.65  CALCIUM 9.5    Liver Function Tests: Recent Labs  Lab 10/11/19 1200  AST 30  ALT 22  ALKPHOS 64  BILITOT 1.0  PROT 7.0  ALBUMIN 4.0   No results for input(s): LIPASE, AMYLASE in the last 168 hours. No results for input(s): AMMONIA in the last 168 hours.  CBC: Recent Labs  Lab 10/11/19 1200  WBC 8.3  HGB 13.9  HCT Unable to determine due to a cold agglutinin  MCV Unable to determine due to a cold agglutinin  PLT 240    Cardiac Enzymes: No results for input(s): CKTOTAL, CKMB, CKMBINDEX, TROPONINI in the last 168 hours.  BNP: Invalid input(s): POCBNP  CBG: No results for input(s): GLUCAP in the last 168 hours.  Microbiology: Results for orders placed or performed during the hospital encounter of 10/11/19  SARS Coronavirus 2 by RT PCR (hospital order, performed in [facility] hospital lab) Nasopharyngeal Nasopharyngeal Swab     Status: None   Collection Time: 10/11/19  1:18 PM   Specimen: Nasopharyngeal Swab  Result Value Ref Range Status   SARS Coronavirus 2 NEGATIVE NEGATIVE Final    Comment: (NOTE) SARS-CoV-2 target nucleic acids are NOT DETECTED. The SARS-CoV-2 RNA is generally detectable in upper and lower respiratory specimens during the acute phase of infection. The lowest concentration of SARS-CoV-2 viral copies this assay can detect is 250 copies / mL. A negative result does not preclude SARS-CoV-2 infection and should not be used as the sole basis for treatment or other patient management decisions.  A negative result may occur with improper specimen collection / handling, submission of specimen other than nasopharyngeal swab, presence of viral mutation(s) within the areas targeted by this assay, and inadequate number of viral copies (<250 copies / mL). A negative result must be combined with clinical observations, patient history, and epidemiological information. Fact Sheet for  Patients:   BoilerBrush.com.cy Fact Sheet for Healthcare Providers: https://pope.com/ This test is not yet approved or cleared  by the [place] FDA and has been authorized for detection and/or diagnosis of SARS-CoV-2 by FDA under an Emergency Use Authorization (EUA).  This EUA will remain in effect (meaning this test can be used) for the duration of the COVID-19 declaration under Section 564(b)(1) of the Act, 21 U.S.C. section 360bbb-3(b)(1), unless the authorization is terminated or revoked sooner. Performed at [facility], [address] [place], [place] [address]     Coagulation Studies: No results for input(s): LABPROT, INR in the last 72 hours.  Urinalysis: Recent Labs    10/11/19 1200  COLORURINE YELLOW*  LABSPEC 1.013  PHURINE 7.0  GLUCOSEU NEGATIVE  HGBUR NEGATIVE  BILIRUBINUR NEGATIVE  KETONESUR NEGATIVE  PROTEINUR NEGATIVE  NITRITE NEGATIVE  LEUKOCYTESUR SMALL*      Imaging: DG Chest Portable 1 View  Result Date: 10/11/2019 CLINICAL DATA:  Weakness EXAM: PORTABLE CHEST 1 VIEW COMPARISON:  None. FINDINGS: Lungs are clear. Heart size and pulmonary vascularity are normal. No adenopathy. There is aortic atherosclerosis. No bone lesions. There is calcification in the left carotid artery. IMPRESSION: Lungs clear.  Cardiac silhouette normal. Aortic Atherosclerosis (ICD10-I70.0). There is also left carotid artery calcification. Electronically Signed   By: Bretta Bang III M.D.   On: 10/11/2019 13:30      Assessment & Plan: Pt is a 86 y.o. female with a PMHx of hyperlipidemia, hypertension, prior history of herpes zoster infection, who was admitted to [facility] on 10/11/2019 for evaluation of generalized weakness.  1.  Hyponatremia.  Patient started on Escitalopram 1 week prior to admission and is also on valsartan/HCTZ at home. 2.  Hypertension.  Plan: Patient appears to have fairly significant  hyponatremia manifested by generalized weakness.  She denies increasing confusion.  Both Escitalopram as well as Diovan/HCTZ could be playing a role.  We will check TSH, cortisol, osmolality, urine osmolality, and serum uric acid level.  We will treat any potential underlying volume depletion with 0.9 normal saline at 75 cc/h.  If this causes lowering of the sodium then this is most likely SIADH.  I have advised the patient to limit her water intake by mouth at this time.  She verbalized understanding.  Target sodium by tomorrow would be 124.  Further plan based upon how patient progresses now.

## 2019-10-11 NOTE — Progress Notes (Signed)
CRITICAL VALUE ALERT  Critical Value:  Serum Blood osmolality - 245  Date & Time Notied:  10/11/2019 1750  Provider Notified: Dr. Joylene Igo  Awaiting orders

## 2019-10-11 NOTE — ED Notes (Addendum)
Pt reports dec appetite, fatigue, dec urination despite inc consumption of beverages. Pt denies burning on urination, new back pain, fever, chills, sweats. Son at bedside "wants to rule out a UTI". Pt was started on new med for anxiety per pt/family. Pt has been on this new med since Tuesday. Family reports pt's symptoms started before starting this new med. Pt's family reports she is eating baseline. History of regular use of ETOH. Pt denies pain. Pt has urine specimen to use when ready to provide sample. Family remains at bedside.

## 2019-10-11 NOTE — ED Notes (Signed)
Pt's family and this RN assisted pt to bedside toilet to attempt to urinate. Pt steady but c/o bilateral leg weakness.

## 2019-10-11 NOTE — ED Triage Notes (Addendum)
Pt presents to ED via POV with her son with c/o generalized weakness x several days. Pt states BLE weakness started on Saturday, was standing at counter and felt like her legs gave out. Pt states concerns for UTI. Pt states fell but denies any injury at this time. Pt A&O x4.    Pt's son reports placed on Escitalopram on Tuesday of last week, reports change in behavior since then.

## 2019-10-12 LAB — CBC
HCT: UNDETERMINED % (ref 36.0–46.0)
Hemoglobin: 12.9 g/dL (ref 12.0–15.0)
MCH: UNDETERMINED pg (ref 26.0–34.0)
MCHC: UNDETERMINED g/dL (ref 30.0–36.0)
MCV: UNDETERMINED fL (ref 80.0–100.0)
Platelets: 203 10*3/uL (ref 150–400)
RBC: 3.88 MIL/uL (ref 3.87–5.11)
RDW: 11.8 % (ref 11.5–15.5)
WBC: 7.4 10*3/uL (ref 4.0–10.5)
nRBC: 0 % (ref 0.0–0.2)

## 2019-10-12 LAB — BASIC METABOLIC PANEL
Anion gap: 11 (ref 5–15)
BUN: 18 mg/dL (ref 8–23)
CO2: 28 mmol/L (ref 22–32)
Calcium: 9.4 mg/dL (ref 8.9–10.3)
Chloride: 82 mmol/L — ABNORMAL LOW (ref 98–111)
Creatinine, Ser: 0.66 mg/dL (ref 0.44–1.00)
GFR calc Af Amer: >60 mL/min (ref >60)
GFR calc non Af Amer: >60 mL/min (ref >60)
Glucose, Bld: 92 mg/dL (ref 70–99)
Potassium: 3.6 mmol/L (ref 3.5–5.1)
Sodium: 121 mmol/L — ABNORMAL LOW (ref 135–145)

## 2019-10-12 MED ORDER — ENSURE ENLIVE PO LIQD
237.0000 mL | Freq: Two times a day (BID) | ORAL | Status: DC
  Administered 2019-10-12 – 2019-10-15 (×6): 237 mL via ORAL

## 2019-10-12 NOTE — Plan of Care (Signed)
  Problem: Education: Goal: Knowledge of General Education information will improve Description: Including pain rating scale, medication(s)/side effects and non-pharmacologic comfort measures Outcome: Progressing   Problem: Clinical Measurements: Goal: Will remain free from infection Outcome: Progressing   Problem: Clinical Measurements: Goal: Cardiovascular complication will be avoided Outcome: Progressing   Problem: Activity: Goal: Risk for activity intolerance will decrease Outcome: Progressing   

## 2019-10-12 NOTE — Progress Notes (Signed)
Initial Nutrition Assessment  DOCUMENTATION CODES:   Not applicable  INTERVENTION:  Continue Ensure Enlive po BID, each supplement provides 350 kcal and 20 grams of protein  NUTRITION DIAGNOSIS:   Inadequate oral intake related to decreased appetite, nausea as evidenced by energy intake < or equal to 50% for > or equal to 5 days, per patient/family report.   GOAL:   Patient will meet greater than or equal to 90% of their needs   MONITOR:   PO intake, Supplement acceptance, Weight trends, Labs, I & O's  REASON FOR ASSESSMENT:   Malnutrition Screening Tool    ASSESSMENT:   86 year old female admitted for hyponatremia after presenting with 4 day history of increasing generalized weakness, poor oral intake and reports a fall at home 2 days prior to coming to hospital secondary to weakness. Past medical history significant for HLD, HTN, and shingles.  Patient awake, alert sitting up in bed talking on the phone at this afternoon at RD visit. She reports eating 1/2 of her [place] sandwich and drinking cranberry juice for lunch today, noted opened Ensure on bedside table. She reports decreased po intake over the past couple of weeks, recalls having significant nausea and only eating crackers drinking soda and lots of water to stay hydrated over the past week. Patient reports normally having a good appetite, reports 3 meals with occasional snacks. She recalls muffin with coffee in the morning, 1/2 sandwich with chips and a cookie for lunch, recalls eating chicken, pork chops, greens, lima beans, and whipped potatoes for dinner, enjoys royal fudge ice cream for dessert. Patient reports occasionally drinking chocolate supplements at home, amenable to chocolate Ensure during admission.   Current wt 131.78 lbs UBW: 135-138 lbs No past weight history available for review, per Care Everywhere pt weighed 134.86 lb on 10/05/19 at [facility]  Medications reviewed and include: Oscal with D, Protonix IVF:  NaCl Labs: Na 121 (L) trending up, Osmolality 245 (L)  NUTRITION - FOCUSED PHYSICAL EXAM: Mild fat depletion to orbital, upper arm regions, moderate fat depletion to buccal region; Mild muscle depletion to temple, clavicle, and dorsal hand regions.  Diet Order:   Diet Order            Diet 2 gram sodium Room service appropriate? Yes; Fluid consistency: Thin  Diet effective now              EDUCATION NEEDS:   No education needs have been identified at this time  Skin:  Skin Assessment: Reviewed RN Assessment  Last BM:  5/31  Height:   Ht Readings from Last 1 Encounters:  10/11/19 5\' 1"  (1.549 m)    Weight:   Wt Readings from Last 1 Encounters:  10/12/19 59.9 kg    BMI:  Body mass index is 24.96 kg/m.  Estimated Nutritional Needs:   Kcal:  1500-1700  Protein:  75-85  Fluid:  >/= 1.5 L/day   12/12/19, RD, LDN Clinical Nutrition After Hours/Weekend Pager # in Amion

## 2019-10-12 NOTE — Progress Notes (Signed)
[facility]  ROUNDING NOTE   Subjective:  Patient seen and evaluated at bedside. Serum sodium up to 121 today. On IV fluid hydration with 0.9 normal saline.   Objective:  Vital signs in last 24 hours:  Temp:  [97.9 F (36.6 C)-98.4 F (36.9 C)] 98.2 F (36.8 C) (06/01 0926) Pulse Rate:  [41-66] 66 (06/01 0926) Resp:  [14-19] 19 (06/01 0926) BP: (130-182)/(47-74) 130/47 (06/01 0926) SpO2:  [95 %-99 %] 97 % (06/01 0926) Weight:  [59.9 kg-61.2 kg] 59.9 kg (06/01 0344)  Weight change:  Filed Weights   10/11/19 1154 10/11/19 1623 10/12/19 0344  Weight: 61.2 kg 60.7 kg 59.9 kg    Intake/Output: I/O last 3 completed shifts: In: 360 [P.O.:360] Out: 200 [Urine:200]   Intake/Output this shift:  Total I/O In: 120 [P.O.:120] Out: -   Physical Exam: General: No acute distress  Head: Normocephalic, atraumatic. Moist oral mucosal membranes  Eyes: Anicteric  Neck: Supple, trachea midline  Lungs:  Clear to auscultation, normal effort  Heart: S1S2 no rubs  Abdomen:  Soft, nontender, bowel sounds present  Extremities: No peripheral edema.  Neurologic: Awake, alert, following commands  Skin: No lesions       Basic Metabolic Panel: Recent Labs  Lab 10/11/19 1200 10/12/19 0534  NA 116* 121*  K 3.8 3.6  CL 77* 82*  CO2 27 28  GLUCOSE 117* 92  BUN 24* 18  CREATININE 0.65 0.66  CALCIUM 9.5 9.4    Liver Function Tests: Recent Labs  Lab 10/11/19 1200  AST 30  ALT 22  ALKPHOS 64  BILITOT 1.0  PROT 7.0  ALBUMIN 4.0   No results for input(s): LIPASE, AMYLASE in the last 168 hours. No results for input(s): AMMONIA in the last 168 hours.  CBC: Recent Labs  Lab 10/11/19 1200 10/12/19 0534  WBC 8.3 7.4  HGB 13.9 12.9  HCT Unable to determine due to a cold agglutinin Unable to determine due to a cold agglutinin  MCV Unable to determine due to a cold agglutinin Unable to determine due to a cold agglutinin  PLT 240 203    Cardiac Enzymes: No  results for input(s): CKTOTAL, CKMB, CKMBINDEX, TROPONINI in the last 168 hours.  BNP: Invalid input(s): POCBNP  CBG: No results for input(s): GLUCAP in the last 168 hours.  Microbiology: Results for orders placed or performed during the hospital encounter of 10/11/19  SARS Coronavirus 2 by RT PCR (hospital order, performed in [facility] hospital lab) Nasopharyngeal Nasopharyngeal Swab     Status: None   Collection Time: 10/11/19  1:18 PM   Specimen: Nasopharyngeal Swab  Result Value Ref Range Status   SARS Coronavirus 2 NEGATIVE NEGATIVE Final    Comment: (NOTE) SARS-CoV-2 target nucleic acids are NOT DETECTED. The SARS-CoV-2 RNA is generally detectable in upper and lower respiratory specimens during the acute phase of infection. The lowest concentration of SARS-CoV-2 viral copies this assay can detect is 250 copies / mL. A negative result does not preclude SARS-CoV-2 infection and should not be used as the sole basis for treatment or other patient management decisions.  A negative result may occur with improper specimen collection / handling, submission of specimen other than nasopharyngeal swab, presence of viral mutation(s) within the areas targeted by this assay, and inadequate number of viral copies (<250 copies / mL). A negative result must be combined with clinical observations, patient history, and epidemiological information. Fact Sheet for Patients:   BoilerBrush.com.cy Fact Sheet for Healthcare Providers: https://pope.com/  This test is not yet approved or cleared  by the [place] and has been authorized for detection and/or diagnosis of SARS-CoV-2 by FDA under an Emergency Use Authorization (EUA).  This EUA will remain in effect (meaning this test can be used) for the duration of the COVID-19 declaration under Section 564(b)(1) of the Act, 21 U.S.C. section 360bbb-3(b)(1), unless the authorization is terminated  or revoked sooner. Performed at [facility], [address] [place], [place] [address]     Coagulation Studies: No results for input(s): LABPROT, INR in the last 72 hours.  Urinalysis: Recent Labs    10/11/19 1200  COLORURINE YELLOW*  LABSPEC 1.013  PHURINE 7.0  GLUCOSEU NEGATIVE  HGBUR NEGATIVE  BILIRUBINUR NEGATIVE  KETONESUR NEGATIVE  PROTEINUR NEGATIVE  NITRITE NEGATIVE  LEUKOCYTESUR SMALL*      Imaging: DG Chest Portable 1 View  Result Date: 10/11/2019 CLINICAL DATA:  Weakness EXAM: PORTABLE CHEST 1 VIEW COMPARISON:  None. FINDINGS: Lungs are clear. Heart size and pulmonary vascularity are normal. No adenopathy. There is aortic atherosclerosis. No bone lesions. There is calcification in the left carotid artery. IMPRESSION: Lungs clear.  Cardiac silhouette normal. Aortic Atherosclerosis (ICD10-I70.0). There is also left carotid artery calcification. Electronically Signed   By: Lowella Grip III M.D.   On: 10/11/2019 13:30     Medications:   . sodium chloride    . sodium chloride     . amLODipine  5 mg Oral Daily  . aspirin EC  81 mg Oral Q1200  . atorvastatin  20 mg Oral Daily  . calcium-vitamin D  1 tablet Oral BID  . enoxaparin (LOVENOX) injection  40 mg Subcutaneous Q24H  . escitalopram  5 mg Oral Daily  . pantoprazole  40 mg Oral Daily  . sodium chloride flush  3 mL Intravenous Once  . sodium chloride flush  3 mL Intravenous Q12H   sodium chloride, acetaminophen **OR** acetaminophen, ondansetron **OR** ondansetron (ZOFRAN) IV, sodium chloride flush  Assessment/ Plan:  86 y.o. female with a PMHx of hyperlipidemia, hypertension, prior history of herpes zoster infection, who was admitted to [facility] on 10/11/2019 for evaluation of generalized weakness.  1.  Hyponatremia.  Patient started on Escitalopram 1 week prior to admission and is also on valsartan/HCTZ at home. 2.  Hypertension.  Plan: As above patient was noted to be on HCTZ and  escitalopram.  Urine sodium therefore not fully evaluated in this case.  However she has responded to IV fluid hydration with 0.9 normal saline which means if there is any some  component of hypovolemia and volume depletion.  Continue 0.9 normal saline for now.  Target sodium by tomorrow would be 129.  In addition will hold escitalopram for now.  Would avoid HCTZ going forward.   LOS: 1 Lyndie Vanderloop 6/1/202111:09 AM

## 2019-10-12 NOTE — Plan of Care (Signed)
°  Problem: Education: °Goal: Knowledge of General Education information will improve °Description: Including pain rating scale, medication(s)/side effects and non-pharmacologic comfort measures °Outcome: Progressing °  °Problem: Clinical Measurements: °Goal: Respiratory complications will improve °Outcome: Progressing °  °Problem: Activity: °Goal: Risk for activity intolerance will decrease °Outcome: Progressing °  °Problem: Safety: °Goal: Ability to remain free from injury will improve °Outcome: Progressing °  °Problem: Skin Integrity: °Goal: Risk for impaired skin integrity will decrease °Outcome: Progressing °  °

## 2019-10-12 NOTE — Progress Notes (Signed)
PROGRESS NOTE    Dana Bryant  HEN:[id number] DOB: 1933/09/13 DOA: 10/11/2019 PCP: Dion Body, MD    Assessment & Plan:   Principal Problem:   Hyponatremia Active Problems:   Bradycardia   Essential hypertension    Dana Bryant is a 86 y.o. Caucasian female with medical history significant for hypertension, depression and dyslipidemia who presents to the emergency room for evaluation of generalized weakness.  Symptoms have been ongoing for about 3 weeks when patient stated that she just felt weak and washed out but was still able to carry out her activities of daily living.     Hyponatremia, POA, improved --Na 116 on presentation, improved to 121 today with IVF. --Most likely related to HCTZ use  --Patient was started on LEXAPRO recently --Nephrology consulted PLAN: --continue MIVF with NS@75  --trend Na, avoid rapid correction --continue to hold LEXAPRO and HCTZ --tele for now  Hypertension Hold Atenolol for now due to bradycardia Hold HCTZ due to hyponatremia Hold Losartan Continue Amlodipine   Generalized weakness Appears to be multifactorial and may be related to hyponatremia as well as bradycardia --PT evaluation once patient's electrolytes have improved   DVT prophylaxis: Lovenox SQ Code Status: Full code  Family Communication:  Status is: inpatient Dispo:   The patient is from: home Anticipated d/c is to: home Anticipated d/c date is: 2-3 days Patient currently is not medically stable to d/c due to: Na still low at 121, on NS IVF, need PT eval.   Subjective and Interval History:  Pt complained of just weakness.  No fever, dyspnea, chest pain, abdominal pain, N/V/D, dysuria.   Objective: Vitals:   10/11/19 2300 10/12/19 0344 10/12/19 0926 10/12/19 1526  BP:  (!) 136/56 (!) 130/47 (!) 117/42  Pulse:  66 66 66  Resp: 14 18 19 19   Temp:  98.3 F (36.8 C) 98.2 F (36.8 C) 98.3 F (36.8 C)  TempSrc:  Oral Oral Oral  SpO2:  97%  97% 96%  Weight:  59.9 kg    Height:        Intake/Output Summary (Last 24 hours) at 10/12/2019 1834 Last data filed at 10/12/2019 1740 Gross per 24 hour  Intake 600 ml  Output 1000 ml  Net -400 ml   Filed Weights   10/11/19 1154 10/11/19 1623 10/12/19 0344  Weight: 61.2 kg 60.7 kg 59.9 kg    Examination:   Constitutional: NAD, AAOx3, sitting up in chair HEENT: conjunctivae and lids normal, EOMI CV: RRR no M,R,G. Distal pulses +2.  No cyanosis.   RESP: CTA B/L, normal respiratory effort  GI: +BS, NTND Extremities: No effusions, edema, or tenderness in BLE SKIN: warm, dry and intact Neuro: II - XII grossly intact.  Sensation intact Psych: Normal mood and affect.  Appropriate judgement and reason   Data Reviewed: I have personally reviewed following labs and imaging studies  CBC: Recent Labs  Lab 10/11/19 1200 10/12/19 0534  WBC 8.3 7.4  HGB 13.9 12.9  HCT Unable to determine due to a cold agglutinin Unable to determine due to a cold agglutinin  MCV Unable to determine due to a cold agglutinin Unable to determine due to a cold agglutinin  PLT 240 361   Basic Metabolic Panel: Recent Labs  Lab 10/11/19 1200 10/12/19 0534  NA 116* 121*  K 3.8 3.6  CL 77* 82*  CO2 27 28  GLUCOSE 117* 92  BUN 24* 18  CREATININE 0.65 0.66  CALCIUM 9.5 9.4   GFR: Estimated Creatinine  Clearance: 44.2 mL/min (by C-G formula based on SCr of 0.66 mg/dL). Liver Function Tests: Recent Labs  Lab 10/11/19 1200  AST 30  ALT 22  ALKPHOS 64  BILITOT 1.0  PROT 7.0  ALBUMIN 4.0   No results for input(s): LIPASE, AMYLASE in the last 168 hours. No results for input(s): AMMONIA in the last 168 hours. Coagulation Profile: No results for input(s): INR, PROTIME in the last 168 hours. Cardiac Enzymes: No results for input(s): CKTOTAL, CKMB, CKMBINDEX, TROPONINI in the last 168 hours. BNP (last 3 results) No results for input(s): PROBNP in the last 8760 hours. HbA1C: No results for  input(s): HGBA1C in the last 72 hours. CBG: No results for input(s): GLUCAP in the last 168 hours. Lipid Profile: No results for input(s): CHOL, HDL, LDLCALC, TRIG, CHOLHDL, LDLDIRECT in the last 72 hours. Thyroid Function Tests: Recent Labs    10/11/19 1632  TSH 4.193   Anemia Panel: No results for input(s): VITAMINB12, FOLATE, FERRITIN, TIBC, IRON, RETICCTPCT in the last 72 hours. Sepsis Labs: No results for input(s): PROCALCITON, LATICACIDVEN in the last 168 hours.  Recent Results (from the past 240 hour(s))  SARS Coronavirus 2 by RT PCR (hospital order, performed in [facility] hospital lab) Nasopharyngeal Nasopharyngeal Swab     Status: None   Collection Time: 10/11/19  1:18 PM   Specimen: Nasopharyngeal Swab  Result Value Ref Range Status   SARS Coronavirus 2 NEGATIVE NEGATIVE Final    Comment: (NOTE) SARS-CoV-2 target nucleic acids are NOT DETECTED. The SARS-CoV-2 RNA is generally detectable in upper and lower respiratory specimens during the acute phase of infection. The lowest concentration of SARS-CoV-2 viral copies this assay can detect is 250 copies / mL. A negative result does not preclude SARS-CoV-2 infection and should not be used as the sole basis for treatment or other patient management decisions.  A negative result may occur with improper specimen collection / handling, submission of specimen other than nasopharyngeal swab, presence of viral mutation(s) within the areas targeted by this assay, and inadequate number of viral copies (<250 copies / mL). A negative result must be combined with clinical observations, patient history, and epidemiological information. Fact Sheet for Patients:   BoilerBrush.com.cy Fact Sheet for Healthcare Providers: https://pope.com/ This test is not yet approved or cleared  by the [place] FDA and has been authorized for detection and/or diagnosis of SARS-CoV-2 by FDA under an  Emergency Use Authorization (EUA).  This EUA will remain in effect (meaning this test can be used) for the duration of the COVID-19 declaration under Section 564(b)(1) of the Act, 21 U.S.C. section 360bbb-3(b)(1), unless the authorization is terminated or revoked sooner. Performed at [facility], [address] [place], [place] [address]       Radiology Studies: DG Chest Portable 1 View  Result Date: 10/11/2019 CLINICAL DATA:  Weakness EXAM: PORTABLE CHEST 1 VIEW COMPARISON:  None. FINDINGS: Lungs are clear. Heart size and pulmonary vascularity are normal. No adenopathy. There is aortic atherosclerosis. No bone lesions. There is calcification in the left carotid artery. IMPRESSION: Lungs clear.  Cardiac silhouette normal. Aortic Atherosclerosis (ICD10-I70.0). There is also left carotid artery calcification. Electronically Signed   By: Bretta Bang III M.D.   On: 10/11/2019 13:30     Scheduled Meds: . amLODipine  5 mg Oral Daily  . aspirin EC  81 mg Oral Q1200  . atorvastatin  20 mg Oral Daily  . calcium-vitamin D  1 tablet Oral BID  . enoxaparin (LOVENOX)  injection  40 mg Subcutaneous Q24H  . feeding supplement (ENSURE ENLIVE)  237 mL Oral BID BM  . pantoprazole  40 mg Oral Daily  . sodium chloride flush  3 mL Intravenous Once  . sodium chloride flush  3 mL Intravenous Q12H   Continuous Infusions: . sodium chloride    . sodium chloride 75 mL/hr at 10/12/19 1156     LOS: 1 day     Darlin Priestly, MD [facility] If 7PM-7AM, please contact night-coverage 10/12/2019, 6:34 PM

## 2019-10-13 LAB — BASIC METABOLIC PANEL
Anion gap: 8 (ref 5–15)
BUN: 16 mg/dL (ref 8–23)
CO2: 28 mmol/L (ref 22–32)
Calcium: 8.6 mg/dL — ABNORMAL LOW (ref 8.9–10.3)
Chloride: 92 mmol/L — ABNORMAL LOW (ref 98–111)
Creatinine, Ser: 0.7 mg/dL (ref 0.44–1.00)
GFR calc Af Amer: >60 mL/min (ref >60)
GFR calc non Af Amer: >60 mL/min (ref >60)
Glucose, Bld: 90 mg/dL (ref 70–99)
Potassium: 3.9 mmol/L (ref 3.5–5.1)
Sodium: 128 mmol/L — ABNORMAL LOW (ref 135–145)

## 2019-10-13 LAB — CBC
HCT: 30 % — ABNORMAL LOW (ref 36.0–46.0)
Hemoglobin: 11 g/dL — ABNORMAL LOW (ref 12.0–15.0)
MCH: 32.5 pg (ref 26.0–34.0)
MCHC: 36.7 g/dL — ABNORMAL HIGH (ref 30.0–36.0)
MCV: 88.8 fL (ref 80.0–100.0)
Platelets: 177 10*3/uL (ref 150–400)
RBC: 3.38 MIL/uL — ABNORMAL LOW (ref 3.87–5.11)
RDW: 11.8 % (ref 11.5–15.5)
WBC: 6.1 10*3/uL (ref 4.0–10.5)
nRBC: 0 % (ref 0.0–0.2)

## 2019-10-13 LAB — MAGNESIUM: Magnesium: 1.7 mg/dL (ref 1.7–2.4)

## 2019-10-13 NOTE — Progress Notes (Signed)
[facility]  ROUNDING NOTE   Subjective:  Patient feeling better today. Serum sodium up to 128. Patient son updated.   Objective:  Vital signs in last 24 hours:  Temp:  [95.4 F (35.2 C)-98.6 F (37 C)] 98.5 F (36.9 C) (06/02 2009) Pulse Rate:  [68-79] 73 (06/02 2009) Resp:  [16-19] 19 (06/02 2009) BP: (105-151)/(38-58) 105/38 (06/02 2009) SpO2:  [95 %-99 %] 98 % (06/02 2009) Weight:  [62.3 kg] 62.3 kg (06/02 0311)  Weight change: 1.043 kg Filed Weights   10/11/19 1623 10/12/19 0344 10/13/19 0311  Weight: 60.7 kg 59.9 kg 62.3 kg    Intake/Output: I/O last 3 completed shifts: In: 2569.2 [P.O.:600; I.V.:1969.2] Out: 2600 [Urine:2600]   Intake/Output this shift:  No intake/output data recorded.  Physical Exam: General: No acute distress  Head: Normocephalic, atraumatic. Moist oral mucosal membranes  Eyes: Anicteric  Neck: Supple, trachea midline  Lungs:  Clear to auscultation, normal effort  Heart: S1S2 no rubs  Abdomen:  Soft, nontender, bowel sounds present  Extremities: No peripheral edema.  Neurologic: Awake, alert, following commands  Skin: No lesions       Basic Metabolic Panel: Recent Labs  Lab 10/11/19 1200 10/12/19 0534 10/13/19 0531  NA 116* 121* 128*  K 3.8 3.6 3.9  CL 77* 82* 92*  CO2 27 28 28   GLUCOSE 117* 92 90  BUN 24* 18 16  CREATININE 0.65 0.66 0.70  CALCIUM 9.5 9.4 8.6*  MG  --   --  1.7    Liver Function Tests: Recent Labs  Lab 10/11/19 1200  AST 30  ALT 22  ALKPHOS 64  BILITOT 1.0  PROT 7.0  ALBUMIN 4.0   No results for input(s): LIPASE, AMYLASE in the last 168 hours. No results for input(s): AMMONIA in the last 168 hours.  CBC: Recent Labs  Lab 10/11/19 1200 10/12/19 0534 10/13/19 0531  WBC 8.3 7.4 6.1  HGB 13.9 12.9 11.0*  HCT Unable to determine due to a cold agglutinin Unable to determine due to a cold agglutinin 30.0*  MCV Unable to determine due to a cold agglutinin Unable to determine due  to a cold agglutinin 88.8  PLT 240 203 177    Cardiac Enzymes: No results for input(s): CKTOTAL, CKMB, CKMBINDEX, TROPONINI in the last 168 hours.  BNP: Invalid input(s): POCBNP  CBG: No results for input(s): GLUCAP in the last 168 hours.  Microbiology: Results for orders placed or performed during the hospital encounter of 10/11/19  SARS Coronavirus 2 by RT PCR (hospital order, performed in [facility] hospital lab) Nasopharyngeal Nasopharyngeal Swab     Status: None   Collection Time: 10/11/19  1:18 PM   Specimen: Nasopharyngeal Swab  Result Value Ref Range Status   SARS Coronavirus 2 NEGATIVE NEGATIVE Final    Comment: (NOTE) SARS-CoV-2 target nucleic acids are NOT DETECTED. The SARS-CoV-2 RNA is generally detectable in upper and lower respiratory specimens during the acute phase of infection. The lowest concentration of SARS-CoV-2 viral copies this assay can detect is 250 copies / mL. A negative result does not preclude SARS-CoV-2 infection and should not be used as the sole basis for treatment or other patient management decisions.  A negative result may occur with improper specimen collection / handling, submission of specimen other than nasopharyngeal swab, presence of viral mutation(s) within the areas targeted by this assay, and inadequate number of viral copies (<250 copies / mL). A negative result must be combined with clinical observations, patient history, and  epidemiological information. Fact Sheet for Patients:   StrictlyIdeas.no Fact Sheet for Healthcare Providers: BankingDealers.co.za This test is not yet approved or cleared  by the [place] FDA and has been authorized for detection and/or diagnosis of SARS-CoV-2 by FDA under an Emergency Use Authorization (EUA).  This EUA will remain in effect (meaning this test can be used) for the duration of the COVID-19 declaration under Section 564(b)(1) of the Act, 21  U.S.C. section 360bbb-3(b)(1), unless the authorization is terminated or revoked sooner. Performed at [facility], [address] [place], [place] [address]     Coagulation Studies: No results for input(s): LABPROT, INR in the last 72 hours.  Urinalysis: Recent Labs    10/11/19 1200  COLORURINE YELLOW*  LABSPEC 1.013  PHURINE 7.0  GLUCOSEU NEGATIVE  HGBUR NEGATIVE  BILIRUBINUR NEGATIVE  KETONESUR NEGATIVE  PROTEINUR NEGATIVE  NITRITE NEGATIVE  LEUKOCYTESUR SMALL*      Imaging: No results found.   Medications:   . sodium chloride    . sodium chloride 75 mL/hr at 10/13/19 1507   . amLODipine  5 mg Oral Daily  . aspirin EC  81 mg Oral Q1200  . atorvastatin  20 mg Oral Daily  . calcium-vitamin D  1 tablet Oral BID  . enoxaparin (LOVENOX) injection  40 mg Subcutaneous Q24H  . feeding supplement (ENSURE ENLIVE)  237 mL Oral BID BM  . pantoprazole  40 mg Oral Daily  . sodium chloride flush  3 mL Intravenous Once  . sodium chloride flush  3 mL Intravenous Q12H   sodium chloride, acetaminophen **OR** acetaminophen, ondansetron **OR** ondansetron (ZOFRAN) IV, sodium chloride flush  Assessment/ Plan:  86 y.o. female with a PMHx of hyperlipidemia, hypertension, prior history of herpes zoster infection, who was admitted to [facility] on 10/11/2019 for evaluation of generalized weakness.  1.  Hyponatremia.  Patient started on Escitalopram 1 week prior to admission and is also on valsartan/HCTZ at home. 2.  Hypertension.  Plan: Sodium continues to trend up and is currently 128.  Continue IV fluid hydration with 0.9 normal saline.  Appropriate rise noted.  Avoid valsartan/HCTZ.  Continue to hold Escitalopram as well.  Further plan as patient progresses.   LOS: 2 Mendel Binsfeld 6/2/20219:23 PM

## 2019-10-13 NOTE — Progress Notes (Signed)
PROGRESS NOTE    Dana Bryant   IZT:[id number]  DOB: [birth year]  PCP: Marisue Ivan, MD    DOA: 10/11/2019 LOS: 2   Brief Narrative   Dana Bryant is a 86 y.o. Caucasian female with medical history significant for hypertension, depression and dyslipidemia who presents to the emergency room for evaluation of generalized weakness.  Symptoms have been ongoing for about 3 weeks when patient stated that she just felt weak and washed out but was still able to carry out her activities of daily living.  Of note, patient reported recent medication changes including a new BP medication and citalopram for depression/anxiety.  Labs were notable for sodium 116 in the ED.  Vitals notable for bradycardia.  Patient admitted to hospitalist service for further evaluation and management.     Assessment & Plan   Principal Problem:   Hyponatremia Active Problems:   Bradycardia   Essential hypertension   Hyponatremia - POA with Na 116, improved with SSRI and HCTZ held, in addition to IV fluids.  Na 128 today.  Most likely secondary to HCTZ more so than SSRI which she had only taken 4 times. --Nephrology following --continue MIVF with NS@75  --trend Na, avoid rapid correction --continue to hold LEXAPRO and HCTZ --telemetry monitoring  Bradycardia - POA.  Takes atenolol for BP. --Hold atenolol --telemetry  Hypertension - chronic, stable.   --Hold Atenolol as above (bradycardia) --Hold ARB/HCTZ combo due to hyponatremia --Continue Amlodipine   Generalized weakness - POA, likely multifactorial due to hyponatremia as well as bradycardia. --PT evaluation  --Home health PT recommended   DVT prophylaxis: Lovenox  Diet:  Diet Orders (From admission, onward)    Start     Ordered   10/11/19 1832  Diet 2 gram sodium Room service appropriate? Yes; Fluid consistency: Thin  Diet effective now    Question Answer Comment  Room service appropriate? Yes   Fluid consistency: Thin      10/11/19 1831            Code Status: Full Code    Subjective 10/13/19    Patient seen at bedside this AM.  No acute events reported.  Patient reports feeling better.  Still has poor appetite.  Denies fever/chills, N/V/D, CP, SOB or other acute complaints.     Disposition Plan & Communication   Status is: Inpatient  Inpatient status remains appropriate because of persistent electrolyte abnormalities requiring close monitoring and IV therapy in hospital.  Dispo: The patient is from: home              Anticipated d/c is to: home              Anticipated d/c date is: 2 days              Patient currently is NOT medically stable for discharge.    Family Communication: none at bedside during encounter, will attempt to call   Consults, Procedures, Significant Events   Consultants:   Nephrology  Procedures:   None  Antimicrobials:   None    Objective   Vitals:   10/13/19 0313 10/13/19 0711 10/13/19 1131 10/13/19 1632  BP: (!) 138/51 (!) 151/58 (!) 119/49 (!) 118/55  Pulse: 73 68 79 73  Resp: 19 18 19 16   Temp: 98 F (36.7 C) 97.6 F (36.4 C) (!) 95.4 F (35.2 C) 98.6 F (37 C)  TempSrc: Oral   Oral  SpO2: 95% 99% 97% 98%  Weight:  Height:        Intake/Output Summary (Last 24 hours) at 10/13/2019 1916 Last data filed at 10/13/2019 1709 Gross per 24 hour  Intake 2089.23 ml  Output 1800 ml  Net 289.23 ml   Filed Weights   10/11/19 1623 10/12/19 0344 10/13/19 0311  Weight: 60.7 kg 59.9 kg 62.3 kg    Physical Exam:  General exam: awake, alert, no acute distress HEENT: moist mucus membranes, hearing grossly normal  Respiratory system: CTAB, no wheezes, rales or rhonchi, normal respiratory effort. Cardiovascular system: normal S1/S2, RRR, no pedal edema.   Central nervous system: A&O x4. no gross focal neurologic deficits, normal speech Extremities: moves all, no edema, normal tone Psychiatry: normal mood, congruent affect, judgement and insight  appear normal  Labs   Data Reviewed: I have personally reviewed following labs and imaging studies  CBC: Recent Labs  Lab 10/11/19 1200 10/12/19 0534 10/13/19 0531  WBC 8.3 7.4 6.1  HGB 13.9 12.9 11.0*  HCT Unable to determine due to a cold agglutinin Unable to determine due to a cold agglutinin 30.0*  MCV Unable to determine due to a cold agglutinin Unable to determine due to a cold agglutinin 88.8  PLT 240 203 416   Basic Metabolic Panel: Recent Labs  Lab 10/11/19 1200 10/12/19 0534 10/13/19 0531  NA 116* 121* 128*  K 3.8 3.6 3.9  CL 77* 82* 92*  CO2 27 28 28   GLUCOSE 117* 92 90  BUN 24* 18 16  CREATININE 0.65 0.66 0.70  CALCIUM 9.5 9.4 8.6*  MG  --   --  1.7   GFR: Estimated Creatinine Clearance: 45.1 mL/min (by C-G formula based on SCr of 0.7 mg/dL). Liver Function Tests: Recent Labs  Lab 10/11/19 1200  AST 30  ALT 22  ALKPHOS 64  BILITOT 1.0  PROT 7.0  ALBUMIN 4.0   No results for input(s): LIPASE, AMYLASE in the last 168 hours. No results for input(s): AMMONIA in the last 168 hours. Coagulation Profile: No results for input(s): INR, PROTIME in the last 168 hours. Cardiac Enzymes: No results for input(s): CKTOTAL, CKMB, CKMBINDEX, TROPONINI in the last 168 hours. BNP (last 3 results) No results for input(s): PROBNP in the last 8760 hours. HbA1C: No results for input(s): HGBA1C in the last 72 hours. CBG: No results for input(s): GLUCAP in the last 168 hours. Lipid Profile: No results for input(s): CHOL, HDL, LDLCALC, TRIG, CHOLHDL, LDLDIRECT in the last 72 hours. Thyroid Function Tests: Recent Labs    10/11/19 1632  TSH 4.193   Anemia Panel: No results for input(s): VITAMINB12, FOLATE, FERRITIN, TIBC, IRON, RETICCTPCT in the last 72 hours. Sepsis Labs: No results for input(s): PROCALCITON, LATICACIDVEN in the last 168 hours.  Recent Results (from the past 240 hour(s))  SARS Coronavirus 2 by RT PCR (hospital order, performed in [facility]  hospital lab) Nasopharyngeal Nasopharyngeal Swab     Status: None   Collection Time: 10/11/19  1:18 PM   Specimen: Nasopharyngeal Swab  Result Value Ref Range Status   SARS Coronavirus 2 NEGATIVE NEGATIVE Final    Comment: (NOTE) SARS-CoV-2 target nucleic acids are NOT DETECTED. The SARS-CoV-2 RNA is generally detectable in upper and lower respiratory specimens during the acute phase of infection. The lowest concentration of SARS-CoV-2 viral copies this assay can detect is 250 copies / mL. A negative result does not preclude SARS-CoV-2 infection and should not be used as the sole basis for treatment or other patient management decisions.  A negative  result may occur with improper specimen collection / handling, submission of specimen other than nasopharyngeal swab, presence of viral mutation(s) within the areas targeted by this assay, and inadequate number of viral copies (<250 copies / mL). A negative result must be combined with clinical observations, patient history, and epidemiological information. Fact Sheet for Patients:   BoilerBrush.com.cy Fact Sheet for Healthcare Providers: https://pope.com/ This test is not yet approved or cleared  by the [place] FDA and has been authorized for detection and/or diagnosis of SARS-CoV-2 by FDA under an Emergency Use Authorization (EUA).  This EUA will remain in effect (meaning this test can be used) for the duration of the COVID-19 declaration under Section 564(b)(1) of the Act, 21 U.S.C. section 360bbb-3(b)(1), unless the authorization is terminated or revoked sooner. Performed at [facility], [address] [place], [place] [address]       Imaging Studies   No results found.   Medications   Scheduled Meds: . amLODipine  5 mg Oral Daily  . aspirin EC  81 mg Oral Q1200  . atorvastatin  20 mg Oral Daily  . calcium-vitamin D  1 tablet Oral BID  . enoxaparin  (LOVENOX) injection  40 mg Subcutaneous Q24H  . feeding supplement (ENSURE ENLIVE)  237 mL Oral BID BM  . pantoprazole  40 mg Oral Daily  . sodium chloride flush  3 mL Intravenous Once  . sodium chloride flush  3 mL Intravenous Q12H   Continuous Infusions: . sodium chloride    . sodium chloride 75 mL/hr at 10/13/19 1507       LOS: 2 days    Time spent: 30 minutes    Pennie Banter, DO [facility]  10/13/2019, 7:16 PM    If 7PM-7AM, please contact night-coverage. How to contact the [facility] Attending or Consulting provider 7A - 7P or covering provider during after hours 7P -7A, for this patient?    1. Check the care team in [facility] and look for a) attending/consulting [facility] provider listed and b) the [facility] team listed 2. Log into [email or web address] and use [place]'s universal password to access. If you do not have the password, please contact the hospital operator. 3. Locate the [facility] provider you are looking for under [facility] and page to a number that you can be directly reached. 4. If you still have difficulty reaching the provider, please page the [facility] (Director on Call) for the Hospitalists listed on amion for assistance.

## 2019-10-13 NOTE — Hospital Course (Signed)
Dana Bryant is a 86 y.o. Caucasian female with medical history significant for hypertension, depression and dyslipidemia who presents to the emergency room for evaluation of generalized weakness.  Symptoms have been ongoing for about 3 weeks when patient stated that she just felt weak and washed out but was still able to carry out her activities of daily living.  Of note, patient reported recent medication changes including a new BP medication and citalopram for depression/anxiety.  Labs were notable for sodium 116 in the ED.  Vitals notable for bradycardia.  Patient admitted to hospitalist service for further evaluation and management.

## 2019-10-13 NOTE — Evaluation (Signed)
Physical Therapy Evaluation Patient Details Name: Dana Bryant MRN: [id number] DOB: 03/01/1934 Today's Date: 10/13/2019   History of Present Illness  Dana Bryant is a 86 y.o. female with medical history significant for hypertension, depression and dyslipidemia who presents to the emergency room for evaluation of generalized weakness.  Symptoms have been ongoing for about 3 weeks when patient stated that she just felt weak and washed out but was still able to carry out her activities of daily living.  Over the last 4 days she has gotten increasingly weak and required the use of a walker for ambulation.  Patient states she fell 2 days prior to coming to the hospital just because she was very weak.  Clinical Impression  Patient reports she is feeling better than yesterday. She is agreeable to PT assessment. Patient performed bed mobility with modified independence, transfers with min assist and ambulated 25 feet with min assist and rw. She demonstrates decreased activity tolerance and weakness. She will continue to benefit from skilled PT while here to improve strength and tolerance for safe return home at discharge.     Follow Up Recommendations Home health PT;Supervision for mobility/OOB    Equipment Recommendations  None recommended by PT    Recommendations for Other Services       Precautions / Restrictions Precautions Precautions: Fall Restrictions Weight Bearing Restrictions: No      Mobility  Bed Mobility Overal bed mobility: Modified Independent             General bed mobility comments: use of rails, increased time  Transfers Overall transfer level: Needs assistance Equipment used: None Transfers: Sit to/from Stand Sit to Stand: Min guard            Ambulation/Gait Ambulation/Gait assistance: Min guard Gait Distance (Feet): 25 Feet Assistive device: Rolling walker (2 wheeled) Gait Pattern/deviations: Step-through pattern;Decreased stride  length Gait velocity: decr   General Gait Details: generally steady, but weak with ambulation.  Stairs            Wheelchair Mobility    Modified Rankin (Stroke Patients Only)       Balance Overall balance assessment: Needs assistance Sitting-balance support: Feet supported Sitting balance-Leahy Scale: Fair     Standing balance support: Bilateral upper extremity supported;During functional activity Standing balance-Leahy Scale: Good Standing balance comment: benefits from RW at this time                             Pertinent Vitals/Pain Pain Assessment: No/denies pain    Home Living Family/patient expects to be discharged to:: Private residence Living Arrangements: Alone Available Help at Discharge: Family;Available PRN/intermittently Type of Home: House Home Access: Stairs to enter   [employer] of Steps: flight Home Layout: Two level;Able to live on main level with bedroom/bathroom Home Equipment: Walker - 2 wheels      Prior Function Level of Independence: Independent         Comments: was walking without device, driving     Hand Dominance        Extremity/Trunk Assessment   Upper Extremity Assessment Upper Extremity Assessment: Generalized weakness    Lower Extremity Assessment Lower Extremity Assessment: Generalized weakness    Cervical / Trunk Assessment Cervical / Trunk Assessment: Normal  Communication   Communication: No difficulties  Cognition Arousal/Alertness: Awake/alert Behavior During Therapy: WFL for tasks assessed/performed Overall Cognitive Status: Within Functional Limits for tasks assessed  General Comments      Exercises     Assessment/Plan    PT Assessment Patient needs continued PT services  PT Problem List Decreased strength;Decreased mobility;Decreased activity tolerance;Decreased balance;Decreased knowledge of use of DME        PT Treatment Interventions DME instruction;Therapeutic exercise;Gait training;Balance training;Stair training;Neuromuscular re-education;Functional mobility training;Therapeutic activities;Patient/family education    PT Goals (Current goals can be found in the Care Plan section)  Acute Rehab PT Goals Patient Stated Goal: to return home at discharge, get stronger PT Goal Formulation: With patient Time For Goal Achievement: 10/27/19 Potential to Achieve Goals: Fair    Frequency Min 2X/week   Barriers to discharge Inaccessible home environment;Decreased caregiver support family may be able to stay with her if necessary, otherwise they are available intermittently.    Co-evaluation               AM-PAC PT "6 Clicks" Mobility  Outcome Measure Help needed turning from your back to your side while in a flat bed without using bedrails?: A Little Help needed moving from lying on your back to sitting on the side of a flat bed without using bedrails?: A Little Help needed moving to and from a bed to a chair (including a wheelchair)?: A Little Help needed standing up from a chair using your arms (e.g., wheelchair or bedside chair)?: A Little Help needed to walk in hospital room?: A Little Help needed climbing 3-5 steps with a railing? : A Lot 6 Click Score: 17    End of Session Equipment Utilized During Treatment: Gait belt Activity Tolerance: Patient limited by fatigue Patient left: in bed;with bed alarm set;with call bell/phone within reach;with family/visitor present Nurse Communication: Mobility status PT Visit Diagnosis: Muscle weakness (generalized) (M62.81);Difficulty in walking, not elsewhere classified (R26.2)    Time: 5916-3846 PT Time Calculation (min) (ACUTE ONLY): 18 min   Charges:   PT Evaluation $PT Eval Moderate Complexity: 1 Mod PT Treatments $Gait Training: 8-22 mins        Navada Osterhout, PT, GCS 10/13/19,3:56 PM

## 2019-10-14 LAB — CBC
HCT: 29.4 % — ABNORMAL LOW (ref 36.0–46.0)
Hemoglobin: 10.5 g/dL — ABNORMAL LOW (ref 12.0–15.0)
MCH: 32.5 pg (ref 26.0–34.0)
MCHC: 35.7 g/dL (ref 30.0–36.0)
MCV: 91 fL (ref 80.0–100.0)
Platelets: 178 10*3/uL (ref 150–400)
RBC: 3.23 MIL/uL — ABNORMAL LOW (ref 3.87–5.11)
RDW: 12 % (ref 11.5–15.5)
WBC: 5.2 10*3/uL (ref 4.0–10.5)
nRBC: 0 % (ref 0.0–0.2)

## 2019-10-14 LAB — BASIC METABOLIC PANEL
Anion gap: 9 (ref 5–15)
BUN: 22 mg/dL (ref 8–23)
CO2: 28 mmol/L (ref 22–32)
Calcium: 9.1 mg/dL (ref 8.9–10.3)
Chloride: 95 mmol/L — ABNORMAL LOW (ref 98–111)
Creatinine, Ser: 0.67 mg/dL (ref 0.44–1.00)
GFR calc Af Amer: >60 mL/min (ref >60)
GFR calc non Af Amer: >60 mL/min (ref >60)
Glucose, Bld: 98 mg/dL (ref 70–99)
Potassium: 4.2 mmol/L (ref 3.5–5.1)
Sodium: 132 mmol/L — ABNORMAL LOW (ref 135–145)

## 2019-10-14 LAB — MAGNESIUM: Magnesium: 1.7 mg/dL (ref 1.7–2.4)

## 2019-10-14 MED ORDER — POLYETHYLENE GLYCOL 3350 17 G PO PACK: 17.0000 g | PACK | Freq: Every day | ORAL | Status: DC

## 2019-10-14 NOTE — Progress Notes (Signed)
[facility]  ROUNDING NOTE   Subjective:  Serum sodium currently 132. Patient feeling better. She will work with physical therapy today.   Objective:  Vital signs in last 24 hours:  Temp:  [98.5 F (36.9 C)-98.7 F (37.1 C)] 98.7 F (37.1 C) (06/03 0736) Pulse Rate:  [73-96] 96 (06/03 1205) Resp:  [16-20] 18 (06/03 1205) BP: (105-145)/(38-68) 144/48 (06/03 1205) SpO2:  [96 %-98 %] 98 % (06/03 1205) Weight:  [63.1 kg] 63.1 kg (06/03 0330)  Weight change: 0.862 kg Filed Weights   10/12/19 0344 10/13/19 0311 10/14/19 0330  Weight: 59.9 kg 62.3 kg 63.1 kg    Intake/Output: I/O last 3 completed shifts: In: 3764.5 [P.O.:120; I.V.:3644.5] Out: 2500 [Urine:2500]   Intake/Output this shift:  Total I/O In: 240 [P.O.:240] Out: 1300 [Urine:1300]  Physical Exam: General: No acute distress  Head: Normocephalic, atraumatic. Moist oral mucosal membranes  Eyes: Anicteric  Neck: Supple, trachea midline  Lungs:  Clear to auscultation, normal effort  Heart: S1S2 no rubs  Abdomen:  Soft, nontender, bowel sounds present  Extremities: No peripheral edema.  Neurologic: Awake, alert, following commands  Skin: No lesions       Basic Metabolic Panel: Recent Labs  Lab 10/11/19 1200 10/11/19 1200 10/12/19 0534 10/13/19 0531 10/14/19 0457  NA 116*  --  121* 128* 132*  K 3.8  --  3.6 3.9 4.2  CL 77*  --  82* 92* 95*  CO2 27  --  28 28 28   GLUCOSE 117*  --  92 90 98  BUN 24*  --  18 16 22   CREATININE 0.65  --  0.66 0.70 0.67  CALCIUM 9.5   < > 9.4 8.6* 9.1  MG  --   --   --  1.7 1.7   < > = values in this interval not displayed.    Liver Function Tests: Recent Labs  Lab 10/11/19 1200  AST 30  ALT 22  ALKPHOS 64  BILITOT 1.0  PROT 7.0  ALBUMIN 4.0   No results for input(s): LIPASE, AMYLASE in the last 168 hours. No results for input(s): AMMONIA in the last 168 hours.  CBC: Recent Labs  Lab 10/11/19 1200 10/12/19 0534 10/13/19 0531 10/14/19 0457   WBC 8.3 7.4 6.1 5.2  HGB 13.9 12.9 11.0* 10.5*  HCT Unable to determine due to a cold agglutinin Unable to determine due to a cold agglutinin 30.0* 29.4*  MCV Unable to determine due to a cold agglutinin Unable to determine due to a cold agglutinin 88.8 91.0  PLT 240 203 177 178    Cardiac Enzymes: No results for input(s): CKTOTAL, CKMB, CKMBINDEX, TROPONINI in the last 168 hours.  BNP: Invalid input(s): POCBNP  CBG: No results for input(s): GLUCAP in the last 168 hours.  Microbiology: Results for orders placed or performed during the hospital encounter of 10/11/19  SARS Coronavirus 2 by RT PCR (hospital order, performed in [facility] hospital lab) Nasopharyngeal Nasopharyngeal Swab     Status: None   Collection Time: 10/11/19  1:18 PM   Specimen: Nasopharyngeal Swab  Result Value Ref Range Status   SARS Coronavirus 2 NEGATIVE NEGATIVE Final    Comment: (NOTE) SARS-CoV-2 target nucleic acids are NOT DETECTED. The SARS-CoV-2 RNA is generally detectable in upper and lower respiratory specimens during the acute phase of infection. The lowest concentration of SARS-CoV-2 viral copies this assay can detect is 250 copies / mL. A negative result does not preclude SARS-CoV-2 infection and should not  be used as the sole basis for treatment or other patient management decisions.  A negative result may occur with improper specimen collection / handling, submission of specimen other than nasopharyngeal swab, presence of viral mutation(s) within the areas targeted by this assay, and inadequate number of viral copies (<250 copies / mL). A negative result must be combined with clinical observations, patient history, and epidemiological information. Fact Sheet for Patients:   StrictlyIdeas.no Fact Sheet for Healthcare Providers: BankingDealers.co.za This test is not yet approved or cleared  by the [place] FDA and has been authorized for  detection and/or diagnosis of SARS-CoV-2 by FDA under an Emergency Use Authorization (EUA).  This EUA will remain in effect (meaning this test can be used) for the duration of the COVID-19 declaration under Section 564(b)(1) of the Act, 21 U.S.C. section 360bbb-3(b)(1), unless the authorization is terminated or revoked sooner. Performed at [facility], [address] [place], [place] [address]     Coagulation Studies: No results for input(s): LABPROT, INR in the last 72 hours.  Urinalysis: No results for input(s): COLORURINE, LABSPEC, PHURINE, GLUCOSEU, HGBUR, BILIRUBINUR, KETONESUR, PROTEINUR, UROBILINOGEN, NITRITE, LEUKOCYTESUR in the last 72 hours.  Invalid input(s): APPERANCEUR    Imaging: No results found.   Medications:   . sodium chloride    . sodium chloride 75 mL/hr at 10/14/19 0332   . amLODipine  5 mg Oral Daily  . aspirin EC  81 mg Oral Q1200  . atorvastatin  20 mg Oral Daily  . calcium-vitamin D  1 tablet Oral BID  . enoxaparin (LOVENOX) injection  40 mg Subcutaneous Q24H  . feeding supplement (ENSURE ENLIVE)  237 mL Oral BID BM  . pantoprazole  40 mg Oral Daily  . polyethylene glycol  17 g Oral Daily  . sodium chloride flush  3 mL Intravenous Once  . sodium chloride flush  3 mL Intravenous Q12H   sodium chloride, acetaminophen **OR** acetaminophen, ondansetron **OR** ondansetron (ZOFRAN) IV, sodium chloride flush  Assessment/ Plan:  86 y.o. female with a PMHx of hyperlipidemia, hypertension, prior history of herpes zoster infection, who was admitted to [facility] on 10/11/2019 for evaluation of generalized weakness.  1.  Hyponatremia.  Patient started on Escitalopram 1 week prior to admission and is also on valsartan/HCTZ at home. 2.  Hypertension.  Plan: Serum sodium continues to rise and currently up to 132.  Continue 0 point normal saline at 75 cc/h for 1 additional day.  Continue to avoid Diovan/HCTZ as well as escitalopram.  Okay to continue  amlodipine.  Agree with ongoing physical therapy evaluation and assessment.  Possible discharge tomorrow.   LOS: 3 Massa Pe 6/3/20212:21 PM

## 2019-10-14 NOTE — Progress Notes (Signed)
Pt ambulated with RN to nursing station using walker/ tolerated well

## 2019-10-14 NOTE — Care Management Important Message (Signed)
Important Message  Patient Details  Name: AIZLYNN DIGILIO MRN: [id number] Date of Birth: [birth year]   Medicare Important Message Given:  Yes     Johnell Comings 10/14/2019, 2:54 PM

## 2019-10-14 NOTE — Progress Notes (Signed)
PROGRESS NOTE    Dana Bryant   ZYS:[id number]  DOB: 24-Oct-1933  PCP: Dion Body, MD    DOA: 10/11/2019 LOS: 3   Brief Narrative   Dana Bryant is a 86 y.o. Caucasian female with medical history significant for hypertension, depression and dyslipidemia who presents to the emergency room for evaluation of generalized weakness.  Symptoms have been ongoing for about 3 weeks when patient stated that she just felt weak and washed out but was still able to carry out her activities of daily living.  Of note, patient reported recent medication changes including a new BP medication and citalopram for depression/anxiety.  Labs were notable for sodium 116 in the ED.  Vitals notable for bradycardia.  Patient admitted to hospitalist service for further evaluation and management.     Assessment & Plan   Principal Problem:   Hyponatremia Active Problems:   Bradycardia   Essential hypertension   Hyponatremia - POA with Na 116, improved with SSRI and HCTZ held, in addition to IV fluids.  Na 132 today.  Most likely secondary to HCTZ more so than SSRI which she had only taken 4 times. --Nephrology following --continue IVF with NS@75  one more day --BMP in AM --continue to hold LEXAPRO and Diovan/HCTZ --telemetry monitoring  Bradycardia - POA.  Takes atenolol for BP. --Hold atenolol --telemetry  Hypertension - chronic, stable.   --Hold Atenolol as above (bradycardia) --Hold ARB/HCTZ combo due to hyponatremia --Continue Amlodipine   Generalized weakness - POA, likely multifactorial due to hyponatremia as well as bradycardia. Patient and son concerned about patient's safety with mobility at home. --PT evaluation  --Home health PT recommended --Ambulate patient every 4 hours to prevent further weakening   DVT prophylaxis: Lovenox  Diet:  Diet Orders (From admission, onward)    Start     Ordered   10/11/19 1832  Diet 2 gram sodium Room service appropriate? Yes;  Fluid consistency: Thin  Diet effective now    Question Answer Comment  Room service appropriate? Yes   Fluid consistency: Thin      10/11/19 1831            Code Status: Full Code    Subjective 10/14/19    Patient seen up in chair eating breakfast this AM.  No acute events reported.  She reports feeling much better.  Asks if she can stay tomorrow if still feeling too weak or unsteady.  Son expressed same concern, want patient to be "steady on her feet" to go home.  Requested PT see again.  Patient denies acute complaints.    Disposition Plan & Communication   Status is: Inpatient  Inpatient status remains appropriate because of persistent electrolyte abnormalities requiring close monitoring and IV therapy in hospital.  Dispo: The patient is from: home              Anticipated d/c is to: home              Anticipated d/c date is: 10/15/19              Patient currently is NOT medically stable for discharge.    Family Communication: son updated this morning along with nephrologist.  Agreeable with plan to d/c home tomorrow, but he asks that PT see patient and make sure she is steady on her feet.   Consults, Procedures, Significant Events   Consultants:   Nephrology  Procedures:   None  Antimicrobials:   None    Objective  Vitals:   10/14/19 0330 10/14/19 0332 10/14/19 0736 10/14/19 1205  BP:  (!) 145/54 (!) 144/68 (!) 144/48  Pulse:  82 82 96  Resp:  20 20 18   Temp:  98.6 F (37 C) 98.7 F (37.1 C)   TempSrc:  Oral Oral   SpO2:  97% 96% 98%  Weight: 63.1 kg     Height:        Intake/Output Summary (Last 24 hours) at 10/14/2019 1500 Last data filed at 10/14/2019 1408 Gross per 24 hour  Intake 1904.05 ml  Output 2600 ml  Net -695.95 ml   Filed Weights   10/12/19 0344 10/13/19 0311 10/14/19 0330  Weight: 59.9 kg 62.3 kg 63.1 kg    Physical Exam:  General exam: awake, alert, no acute distress HEENT: moist mucus membranes, hearing grossly normal    Respiratory system: CTAB, normal respiratory effort. Cardiovascular system: normal S1/S2, RRR, no pedal edema.   Central nervous system: A&O x4. no gross focal neurologic deficits, normal speech Extremities: moves all, no edema, normal tone Psychiatry: normal mood, congruent affect  Labs   Data Reviewed: I have personally reviewed following labs and imaging studies  CBC: Recent Labs  Lab 10/11/19 1200 10/12/19 0534 10/13/19 0531 10/14/19 0457  WBC 8.3 7.4 6.1 5.2  HGB 13.9 12.9 11.0* 10.5*  HCT Unable to determine due to a cold agglutinin Unable to determine due to a cold agglutinin 30.0* 29.4*  MCV Unable to determine due to a cold agglutinin Unable to determine due to a cold agglutinin 88.8 91.0  PLT 240 203 177 178   Basic Metabolic Panel: Recent Labs  Lab 10/11/19 1200 10/12/19 0534 10/13/19 0531 10/14/19 0457  NA 116* 121* 128* 132*  K 3.8 3.6 3.9 4.2  CL 77* 82* 92* 95*  CO2 27 28 28 28   GLUCOSE 117* 92 90 98  BUN 24* 18 16 22   CREATININE 0.65 0.66 0.70 0.67  CALCIUM 9.5 9.4 8.6* 9.1  MG  --   --  1.7 1.7   GFR: Estimated Creatinine Clearance: 45.3 mL/min (by C-G formula based on SCr of 0.67 mg/dL). Liver Function Tests: Recent Labs  Lab 10/11/19 1200  AST 30  ALT 22  ALKPHOS 64  BILITOT 1.0  PROT 7.0  ALBUMIN 4.0   No results for input(s): LIPASE, AMYLASE in the last 168 hours. No results for input(s): AMMONIA in the last 168 hours. Coagulation Profile: No results for input(s): INR, PROTIME in the last 168 hours. Cardiac Enzymes: No results for input(s): CKTOTAL, CKMB, CKMBINDEX, TROPONINI in the last 168 hours. BNP (last 3 results) No results for input(s): PROBNP in the last 8760 hours. HbA1C: No results for input(s): HGBA1C in the last 72 hours. CBG: No results for input(s): GLUCAP in the last 168 hours. Lipid Profile: No results for input(s): CHOL, HDL, LDLCALC, TRIG, CHOLHDL, LDLDIRECT in the last 72 hours. Thyroid Function Tests: Recent  Labs    10/11/19 1632  TSH 4.193   Anemia Panel: No results for input(s): VITAMINB12, FOLATE, FERRITIN, TIBC, IRON, RETICCTPCT in the last 72 hours. Sepsis Labs: No results for input(s): PROCALCITON, LATICACIDVEN in the last 168 hours.  Recent Results (from the past 240 hour(s))  SARS Coronavirus 2 by RT PCR (hospital order, performed in [facility] hospital lab) Nasopharyngeal Nasopharyngeal Swab     Status: None   Collection Time: 10/11/19  1:18 PM   Specimen: Nasopharyngeal Swab  Result Value Ref Range Status   SARS Coronavirus 2 NEGATIVE NEGATIVE Final  Comment: (NOTE) SARS-CoV-2 target nucleic acids are NOT DETECTED. The SARS-CoV-2 RNA is generally detectable in upper and lower respiratory specimens during the acute phase of infection. The lowest concentration of SARS-CoV-2 viral copies this assay can detect is 250 copies / mL. A negative result does not preclude SARS-CoV-2 infection and should not be used as the sole basis for treatment or other patient management decisions.  A negative result may occur with improper specimen collection / handling, submission of specimen other than nasopharyngeal swab, presence of viral mutation(s) within the areas targeted by this assay, and inadequate number of viral copies (<250 copies / mL). A negative result must be combined with clinical observations, patient history, and epidemiological information. Fact Sheet for Patients:   BoilerBrush.com.cy Fact Sheet for Healthcare Providers: https://pope.com/ This test is not yet approved or cleared  by the [place] FDA and has been authorized for detection and/or diagnosis of SARS-CoV-2 by FDA under an Emergency Use Authorization (EUA).  This EUA will remain in effect (meaning this test can be used) for the duration of the COVID-19 declaration under Section 564(b)(1) of the Act, 21 U.S.C. section 360bbb-3(b)(1), unless the authorization is  terminated or revoked sooner. Performed at [facility], [address] [place], [place] [address]       Imaging Studies   No results found.   Medications   Scheduled Meds: . amLODipine  5 mg Oral Daily  . aspirin EC  81 mg Oral Q1200  . atorvastatin  20 mg Oral Daily  . calcium-vitamin D  1 tablet Oral BID  . enoxaparin (LOVENOX) injection  40 mg Subcutaneous Q24H  . feeding supplement (ENSURE ENLIVE)  237 mL Oral BID BM  . pantoprazole  40 mg Oral Daily  . polyethylene glycol  17 g Oral Daily  . sodium chloride flush  3 mL Intravenous Once  . sodium chloride flush  3 mL Intravenous Q12H   Continuous Infusions: . sodium chloride    . sodium chloride 75 mL/hr at 10/14/19 0332       LOS: 3 days    Time spent: 30 minutes    Pennie Banter, DO [facility]  10/14/2019, 3:00 PM    If 7PM-7AM, please contact night-coverage. How to contact the [facility] Attending or Consulting provider 7A - 7P or covering provider during after hours 7P -7A, for this patient?    1. Check the care team in [facility] and look for a) attending/consulting [facility] provider listed and b) the [facility] team listed 2. Log into [email or web address] and use [place]'s universal password to access. If you do not have the password, please contact the hospital operator. 3. Locate the [facility] provider you are looking for under [facility] and page to a number that you can be directly reached. 4. If you still have difficulty reaching the provider, please page the [facility] (Director on Call) for the Hospitalists listed on amion for assistance.

## 2019-10-15 LAB — CBC
HCT: 30.9 % — ABNORMAL LOW (ref 36.0–46.0)
Hemoglobin: 11.2 g/dL — ABNORMAL LOW (ref 12.0–15.0)
MCH: 32.7 pg (ref 26.0–34.0)
MCHC: 36.2 g/dL — ABNORMAL HIGH (ref 30.0–36.0)
MCV: 90.1 fL (ref 80.0–100.0)
Platelets: 168 10*3/uL (ref 150–400)
RBC: 3.43 MIL/uL — ABNORMAL LOW (ref 3.87–5.11)
RDW: 12.2 % (ref 11.5–15.5)
WBC: 5.9 10*3/uL (ref 4.0–10.5)
nRBC: 0 % (ref 0.0–0.2)

## 2019-10-15 LAB — BASIC METABOLIC PANEL
Anion gap: 7 (ref 5–15)
BUN: 17 mg/dL (ref 8–23)
CO2: 29 mmol/L (ref 22–32)
Calcium: 8.6 mg/dL — ABNORMAL LOW (ref 8.9–10.3)
Chloride: 97 mmol/L — ABNORMAL LOW (ref 98–111)
Creatinine, Ser: 0.66 mg/dL (ref 0.44–1.00)
GFR calc Af Amer: >60 mL/min (ref >60)
GFR calc non Af Amer: >60 mL/min (ref >60)
Glucose, Bld: 95 mg/dL (ref 70–99)
Potassium: 4.2 mmol/L (ref 3.5–5.1)
Sodium: 133 mmol/L — ABNORMAL LOW (ref 135–145)

## 2019-10-15 LAB — MAGNESIUM: Magnesium: 1.5 mg/dL — ABNORMAL LOW (ref 1.7–2.4)

## 2019-10-15 MED ORDER — ACETAMINOPHEN 325 MG PO TABS: 650.0000 mg | ORAL_TABLET | Freq: Four times a day (QID) | ORAL | Status: AC | PRN

## 2019-10-15 MED ORDER — ONDANSETRON HCL 4 MG PO TABS: 4.0000 mg | ORAL_TABLET | Freq: Four times a day (QID) | ORAL | 0 refills | Status: AC | PRN

## 2019-10-15 MED ORDER — ENSURE ENLIVE PO LIQD: 237.0000 mL | Freq: Two times a day (BID) | ORAL | 12 refills | Status: AC

## 2019-10-15 MED ORDER — MAGNESIUM SULFATE 4 GM/100ML IV SOLN
4.0000 g | Freq: Once | INTRAVENOUS | Status: AC
  Administered 2019-10-15: 4 g via INTRAVENOUS
  Filled 2019-10-15: qty 100

## 2019-10-15 NOTE — Progress Notes (Signed)
Ambulated around nurses station and back to room (appoximately 238ft) with walker and 1 assist..  Much encouragement needed to get out of bed and ambulate.  Tolerated without distress

## 2019-10-15 NOTE — Progress Notes (Signed)
encouraged pt to walk or get out of bed to chair/ pt declines at this time/ will re attempt later

## 2019-10-15 NOTE — Progress Notes (Signed)
[facility]  ROUNDING NOTE   Subjective:  Serum sodium only marginally higher today at 133. Good urine output at 2.7 L yesterday. Ambulating on the ward this a.m. with physical therapy. Normal renal function.   Objective:  Vital signs in last 24 hours:  Temp:  [98.2 F (36.8 C)-98.5 F (36.9 C)] 98.4 F (36.9 C) (06/04 0733) Pulse Rate:  [81-96] 95 (06/04 0733) Resp:  [16-19] 19 (06/04 0733) BP: (135-173)/(48-63) 173/61 (06/04 0733) SpO2:  [97 %-98 %] 98 % (06/04 0733) Weight:  [63.5 kg] 63.5 kg (06/04 0520)  Weight change: 0.363 kg Filed Weights   10/13/19 0311 10/14/19 0330 10/15/19 0520  Weight: 62.3 kg 63.1 kg 63.5 kg    Intake/Output: I/O last 3 completed shifts: In: 3875.2 [P.O.:486; I.V.:3389.2] Out: 3800 [Urine:3800]   Intake/Output this shift:  Total I/O In: 381.7 [I.V.:381.7] Out: 300 [Urine:300]  Physical Exam: General: No acute distress  Head: Normocephalic, atraumatic. Moist oral mucosal membranes  Eyes: Anicteric  Neck: Supple, trachea midline  Lungs:  Clear to auscultation, normal effort  Heart: S1S2 no rubs  Abdomen:  Soft, nontender, bowel sounds present  Extremities: No peripheral edema.  Neurologic: Awake, alert, following commands  Skin: No lesions       Basic Metabolic Panel: Recent Labs  Lab 10/11/19 1200 10/11/19 1200 10/12/19 0534 10/12/19 0534 10/13/19 0531 10/14/19 0457 10/15/19 0431  NA 116*  --  121*  --  128* 132* 133*  K 3.8  --  3.6  --  3.9 4.2 4.2  CL 77*  --  82*  --  92* 95* 97*  CO2 27  --  28  --  28 28 29   GLUCOSE 117*  --  92  --  90 98 95  BUN 24*  --  18  --  16 22 17   CREATININE 0.65  --  0.66  --  0.70 0.67 0.66  CALCIUM 9.5   < > 9.4   < > 8.6* 9.1 8.6*  MG  --   --   --   --  1.7 1.7 1.5*   < > = values in this interval not displayed.    Liver Function Tests: Recent Labs  Lab 10/11/19 1200  AST 30  ALT 22  ALKPHOS 64  BILITOT 1.0  PROT 7.0  ALBUMIN 4.0   No results for  input(s): LIPASE, AMYLASE in the last 168 hours. No results for input(s): AMMONIA in the last 168 hours.  CBC: Recent Labs  Lab 10/11/19 1200 10/12/19 0534 10/13/19 0531 10/14/19 0457 10/15/19 0431  WBC 8.3 7.4 6.1 5.2 5.9  HGB 13.9 12.9 11.0* 10.5* 11.2*  HCT Unable to determine due to a cold agglutinin Unable to determine due to a cold agglutinin 30.0* 29.4* 30.9*  MCV Unable to determine due to a cold agglutinin Unable to determine due to a cold agglutinin 88.8 91.0 90.1  PLT 240 203 177 178 168    Cardiac Enzymes: No results for input(s): CKTOTAL, CKMB, CKMBINDEX, TROPONINI in the last 168 hours.  BNP: Invalid input(s): POCBNP  CBG: No results for input(s): GLUCAP in the last 168 hours.  Microbiology: Results for orders placed or performed during the hospital encounter of 10/11/19  SARS Coronavirus 2 by RT PCR (hospital order, performed in [facility] hospital lab) Nasopharyngeal Nasopharyngeal Swab     Status: None   Collection Time: 10/11/19  1:18 PM   Specimen: Nasopharyngeal Swab  Result Value Ref Range Status   SARS Coronavirus  2 NEGATIVE NEGATIVE Final    Comment: (NOTE) SARS-CoV-2 target nucleic acids are NOT DETECTED. The SARS-CoV-2 RNA is generally detectable in upper and lower respiratory specimens during the acute phase of infection. The lowest concentration of SARS-CoV-2 viral copies this assay can detect is 250 copies / mL. A negative result does not preclude SARS-CoV-2 infection and should not be used as the sole basis for treatment or other patient management decisions.  A negative result may occur with improper specimen collection / handling, submission of specimen other than nasopharyngeal swab, presence of viral mutation(s) within the areas targeted by this assay, and inadequate number of viral copies (<250 copies / mL). A negative result must be combined with clinical observations, patient history, and epidemiological information. Fact Sheet for  Patients:   BoilerBrush.com.cy Fact Sheet for Healthcare Providers: https://pope.com/ This test is not yet approved or cleared  by the [place] FDA and has been authorized for detection and/or diagnosis of SARS-CoV-2 by FDA under an Emergency Use Authorization (EUA).  This EUA will remain in effect (meaning this test can be used) for the duration of the COVID-19 declaration under Section 564(b)(1) of the Act, 21 U.S.C. section 360bbb-3(b)(1), unless the authorization is terminated or revoked sooner. Performed at [facility], [address] [place], [place] [address]     Coagulation Studies: No results for input(s): LABPROT, INR in the last 72 hours.  Urinalysis: No results for input(s): COLORURINE, LABSPEC, PHURINE, GLUCOSEU, HGBUR, BILIRUBINUR, KETONESUR, PROTEINUR, UROBILINOGEN, NITRITE, LEUKOCYTESUR in the last 72 hours.  Invalid input(s): APPERANCEUR    Imaging: No results found.   Medications:   . sodium chloride    . sodium chloride 75 mL/hr at 10/15/19 0725  . magnesium sulfate bolus IVPB 4 g (10/15/19 0955)   . amLODipine  5 mg Oral Daily  . aspirin EC  81 mg Oral Q1200  . atorvastatin  20 mg Oral Daily  . calcium-vitamin D  1 tablet Oral BID  . enoxaparin (LOVENOX) injection  40 mg Subcutaneous Q24H  . feeding supplement (ENSURE ENLIVE)  237 mL Oral BID BM  . pantoprazole  40 mg Oral Daily  . polyethylene glycol  17 g Oral Daily  . sodium chloride flush  3 mL Intravenous Once  . sodium chloride flush  3 mL Intravenous Q12H   sodium chloride, acetaminophen **OR** acetaminophen, ondansetron **OR** ondansetron (ZOFRAN) IV, sodium chloride flush  Assessment/ Plan:  86 y.o. female with a PMHx of hyperlipidemia, hypertension, prior history of herpes zoster infection, who was admitted to [facility] on 10/11/2019 for evaluation of generalized weakness.  1.  Hyponatremia.  Patient started on Escitalopram 1  week prior to admission and was also on valsartan/HCTZ at home. 2.  Hypertension.  Plan: Serum sodium up slightly over the preceding 24 hours to 133.  Good urine output noted at 2.7 L.  Would hold Diovan HCTZ as well as escitalopram.  Okay to stop IV fluid hydration now.  Recommend follow-up in the office with [place] in 1 to 2 weeks post discharge.  Thanks for allowing [place] to participate.   LOS: 4 Pike Scantlebury 6/4/202110:35 AM

## 2019-10-15 NOTE — Progress Notes (Signed)
Discharge instructions explained to pt and pts son's/ verbalized an understanding/ iv and tele removed/  Discharge report called to [facility]/ pt transported off unit via wheelchair/ pts son's transported to SNF

## 2019-10-15 NOTE — Progress Notes (Signed)
Physical Therapy Treatment Patient Details Name: Dana Bryant MRN: [id number] DOB: 1933-09-27 Today's Date: 10/15/2019    History of Present Illness Dana Bryant is a 86 y.o. female with medical history significant for hypertension, depression and dyslipidemia who presents to the emergency room for evaluation of generalized weakness.  Symptoms have been ongoing for about 3 weeks when patient stated that she just felt weak and washed out but was still able to carry out her activities of daily living.  Over the last 4 days she has gotten increasingly weak and required the use of a walker for ambulation.  Patient states she fell 2 days prior to coming to the hospital just because she was very weak.    PT Comments    Pt was supine in bed upon arriving. She is A and O x 4 but anxious about upcoming DC. She was able to exit L side of bed without assistance.  Stood to [employer] without assistance and ambulated 2900 ft without LOB or unsteadiness. She does state she feels more confident with ambulation but states several times she does not feel ready to go home. Therapist discussed recommendations with sons and patient. They all are requesting SNF at DC. PT recommends DC to home with HHPT to follow. MD/CM aware. Acute PT will continue to follow. Pt was seated in recliner with BLEs elevated and call bell in reach.    Follow Up Recommendations  Home health PT     Equipment Recommendations  None recommended by PT    Recommendations for Other Services       Precautions / Restrictions Precautions Precautions: Fall Restrictions Weight Bearing Restrictions: No    Mobility  Bed Mobility Overal bed mobility: Modified Independent             General bed mobility comments: pt was able to exit L side of bed with bed in flat position.  Transfers Overall transfer level: Modified independent Equipment used: Rolling walker (2 wheeled) Transfers: Sit to/from Stand Sit to Stand: Modified  independent (Device/Increase time)         General transfer comment: no assistance required to STS EOB  Ambulation/Gait Ambulation/Gait assistance: Modified independent (Device/Increase time) Gait Distance (Feet): 200 Feet Assistive device: Rolling walker (2 wheeled) Gait Pattern/deviations: Step-through pattern;Trunk flexed Gait velocity: decreased   General Gait Details: Pt was able to ambulate 200 ft with RW without LOB or unsteadiness   Stairs             Wheelchair Mobility    Modified Rankin (Stroke Patients Only)       Balance Overall balance assessment: Modified Independent Sitting-balance support: Feet supported Sitting balance-Leahy Scale: Good     Standing balance support: Bilateral upper extremity supported;During functional activity Standing balance-Leahy Scale: Good Standing balance comment: no LOB with BUE support on RW                            Cognition Arousal/Alertness: Awake/alert Behavior During Therapy: [facility] for tasks assessed/performed;Anxious Overall Cognitive Status: Within Functional Limits for tasks assessed                                 General Comments: pt is anxious about upcoming DC.  she is alert and oriented x 4      Exercises      General Comments        Pertinent  Vitals/Pain Pain Assessment: No/denies pain    Home Living                      Prior Function            PT Goals (current goals can now be found in the care plan section) Acute Rehab PT Goals Patient Stated Goal: to get stronger and move better Progress towards PT goals: Progressing toward goals    Frequency    Min 2X/week      PT Plan Current plan remains appropriate    Co-evaluation              AM-PAC PT "6 Clicks" Mobility   Outcome Measure  Help needed turning from your back to your side while in a flat bed without using bedrails?: A Little Help needed moving from lying on your back to  sitting on the side of a flat bed without using bedrails?: A Little Help needed moving to and from a bed to a chair (including a wheelchair)?: A Little Help needed standing up from a chair using your arms (e.g., wheelchair or bedside chair)?: A Little Help needed to walk in hospital room?: A Little Help needed climbing 3-5 steps with a railing? : A Little 6 Click Score: 18    End of Session Equipment Utilized During Treatment: Gait belt Activity Tolerance: Patient tolerated treatment well Patient left: in chair;with chair alarm set;with family/visitor present;with call bell/phone within reach Nurse Communication: Mobility status PT Visit Diagnosis: Muscle weakness (generalized) (M62.81);Difficulty in walking, not elsewhere classified (R26.2)     Time: 0900-0930 PT Time Calculation (min) (ACUTE ONLY): 30 min  Charges:  $Gait Training: 8-22 mins $Therapeutic Activity: 8-22 mins                     Julaine Fusi PTA 10/15/19, 10:46 AM

## 2019-10-15 NOTE — Discharge Summary (Signed)
Physician Discharge Summary  Dana Bryant TGG:[id number] DOB: 1934/02/15 DOA: 10/11/2019  PCP: Marisue Ivan, MD  Admit date: 10/11/2019 Discharge date: 10/15/2019  Admitted From: home Disposition:  SNF  Recommendations for Outpatient Follow-up:  1. Follow up with PCP in 1-2 weeks 2. Please obtain BMP/CBC in one week 3.   Diovan and Lexapro were stopped during admission due to hyponatremia.   4.  Recommend avoiding diuretics if possible in the future 5.  Atenolol was discontinued due to bradycardia.  HR has been 80's to 90's, but diastolic BP is on low side, so atenolol was discontinued on discharge.  Please follow up on HR and BP to consider resuming beta blocker if indicated.  Home Health: No  Equipment/Devices: None   Discharge Condition: Stable  CODE STATUS: Full  Diet recommendation: Heart Healthy   Discharge Diagnoses: Principal Problem:   Hyponatremia Active Problems:   Bradycardia   Essential hypertension    Summary of HPI and Hospital Course:  Dana Bryant is a 86 y.o. Caucasian female with medical history significant for hypertension, depression and dyslipidemia who presents to the emergency room for evaluation of generalized weakness.  Symptoms have been ongoing for about 3 weeks when patient stated that she just felt weak and washed out but was still able to carry out her activities of daily living.  Of note, patient reported recent medication changes including a new BP medication and citalopram for depression/anxiety.  Labs were notable for sodium 116 in the ED.  Vitals notable for bradycardia.  Patient admitted to hospitalist service for further evaluation and management.   Hyponatremia - POA with Na 116, improved with SSRI and HCTZ held, in addition to IV fluids.  Na 132 today.  Most likely secondary to HCTZ more so than SSRI which she had only taken 4 times.  Nephrology was consulted.  Lexapro and Diovan were discontinued.  Patient's sodium level  improved with IV hydration.   --Obtain BMP in 1 week  Bradycardia - POA.  Takes atenolol for BP.  This was held during admission.  Diastolic pressures off BP meds are on low side, will d/c all BP meds on discharge and recommend resuming if needed in follow up.   HR has been in the 80's-90's off the beta blocker.  Hypertension - chronic, stable.  Continue Amlodipine only.  Atenolol stopped due to bradycardia.  Diovan stopped due to hyponatremia.  BP well controlled with low diastolic pressures on amlodipine alone.  Generalized weakness - POA, likely multifactorial due to hyponatremia as well as bradycardia. Patient and son concerned about patient's safety with mobility at home.  PT evaluated patient and recommended home health therapy, however patient and family did not feel safe with her returning home alone.  Patient and family elected to self-pay for short-term rehab stay.      Discharge Instructions   Discharge Instructions    Call MD for:  extreme fatigue   Complete by: As directed    Call MD for:  persistant dizziness or light-headedness   Complete by: As directed    Call MD for:  persistant nausea and vomiting   Complete by: As directed    Call MD for:  temperature >100.4   Complete by: As directed    Diet - low sodium heart healthy   Complete by: As directed    Increase activity slowly   Complete by: As directed      Allergies as of 10/15/2019      Reactions  Famciclovir Rash   Sulfa Antibiotics Swelling, Rash      Medication List    STOP taking these medications   atenolol 100 MG tablet Commonly known as: TENORMIN   escitalopram 5 MG tablet Commonly known as: LEXAPRO   metoCLOPramide 10 MG tablet Commonly known as: REGLAN   valsartan-hydrochlorothiazide 320-25 MG tablet Commonly known as: DIOVAN-HCT     TAKE these medications   acetaminophen 325 MG tablet Commonly known as: TYLENOL Take 2 tablets (650 mg total) by mouth every 6 (six) hours as needed  for mild pain (or Fever >/= 101).   amLODipine 5 MG tablet Commonly known as: NORVASC Take 5 mg by mouth daily.   aspirin 81 MG EC tablet Take 81 mg by mouth daily at 12 noon.   atorvastatin 20 MG tablet Commonly known as: LIPITOR Take 20 mg by mouth daily.   Calcium Carbonate-Vitamin D 600-400 MG-UNIT tablet Take 1 tablet by mouth 2 (two) times daily.   feeding supplement (ENSURE ENLIVE) Liqd Take 237 mLs by mouth 2 (two) times daily between meals.   omeprazole 20 MG capsule Commonly known as: PRILOSEC Take 20 mg by mouth daily.   ondansetron 4 MG tablet Commonly known as: ZOFRAN Take 1 tablet (4 mg total) by mouth every 6 (six) hours as needed for nausea.       Allergies  Allergen Reactions  . Famciclovir Rash  . Sulfa Antibiotics Swelling and Rash    Consultations:  Nephrology   Procedures/Studies: DG Chest Portable 1 View  Result Date: 10/11/2019 CLINICAL DATA:  Weakness EXAM: PORTABLE CHEST 1 VIEW COMPARISON:  None. FINDINGS: Lungs are clear. Heart size and pulmonary vascularity are normal. No adenopathy. There is aortic atherosclerosis. No bone lesions. There is calcification in the left carotid artery. IMPRESSION: Lungs clear.  Cardiac silhouette normal. Aortic Atherosclerosis (ICD10-I70.0). There is also left carotid artery calcification. Electronically Signed   By: Bretta Bang III M.D.   On: 10/11/2019 13:30       Subjective: Patient seen walking in hall with PT and appeared slow but steady.  Seen in chair in room afterward.  She says she doesn't feel safe going home.  Dessie Coma says she feels much better.  Patient's two sons report that one month ago, patient was very independent and active and they hope for her to get back to that state.  No acute events reported.   Discharge Exam: Vitals:   10/15/19 0733 10/15/19 1117  BP: (!) 173/61 (!) 140/55  Pulse: 95 86  Resp: 19 18  Temp: 98.4 F (36.9 C) 98.3 F (36.8 C)  SpO2: 98% 99%   Vitals:    10/15/19 0327 10/15/19 0520 10/15/19 0733 10/15/19 1117  BP: (!) 167/63  (!) 173/61 (!) 140/55  Pulse: 88  95 86  Resp: 16  19 18   Temp: 98.2 F (36.8 C)  98.4 F (36.9 C) 98.3 F (36.8 C)  TempSrc: Oral  Oral   SpO2: 97%  98% 99%  Weight:  63.5 kg    Height:        General: Pt is alert, awake, not in acute distress Cardiovascular: RRR, S1/S2 +, no rubs, no gallops Respiratory: CTA bilaterally, no wheezing, no rhonchi Abdominal: Soft, NT, ND, bowel sounds + Extremities: no edema, no cyanosis    The results of significant diagnostics from this hospitalization (including imaging, microbiology, ancillary and laboratory) are listed below for reference.     Microbiology: Recent Results (from the past 240 hour(s))  SARS Coronavirus  2 by RT PCR (hospital order, performed in [facility] hospital lab) Nasopharyngeal Nasopharyngeal Swab     Status: None   Collection Time: 10/11/19  1:18 PM   Specimen: Nasopharyngeal Swab  Result Value Ref Range Status   SARS Coronavirus 2 NEGATIVE NEGATIVE Final    Comment: (NOTE) SARS-CoV-2 target nucleic acids are NOT DETECTED. The SARS-CoV-2 RNA is generally detectable in upper and lower respiratory specimens during the acute phase of infection. The lowest concentration of SARS-CoV-2 viral copies this assay can detect is 250 copies / mL. A negative result does not preclude SARS-CoV-2 infection and should not be used as the sole basis for treatment or other patient management decisions.  A negative result may occur with improper specimen collection / handling, submission of specimen other than nasopharyngeal swab, presence of viral mutation(s) within the areas targeted by this assay, and inadequate number of viral copies (<250 copies / mL). A negative result must be combined with clinical observations, patient history, and epidemiological information. Fact Sheet for Patients:   StrictlyIdeas.no Fact Sheet for  Healthcare Providers: BankingDealers.co.za This test is not yet approved or cleared  by the [place] FDA and has been authorized for detection and/or diagnosis of SARS-CoV-2 by FDA under an Emergency Use Authorization (EUA).  This EUA will remain in effect (meaning this test can be used) for the duration of the COVID-19 declaration under Section 564(b)(1) of the Act, 21 U.S.C. section 360bbb-3(b)(1), unless the authorization is terminated or revoked sooner. Performed at [facility], [address] [place], [place] [address]      Labs: BNP (last 3 results) No results for input(s): BNP in the last 8760 hours. Basic Metabolic Panel: Recent Labs  Lab 10/11/19 1200 10/12/19 0534 10/13/19 0531 10/14/19 0457 10/15/19 0431  NA 116* 121* 128* 132* 133*  K 3.8 3.6 3.9 4.2 4.2  CL 77* 82* 92* 95* 97*  CO2 27 28 28 28 29   GLUCOSE 117* 92 90 98 95  BUN 24* 18 16 22 17   CREATININE 0.65 0.66 0.70 0.67 0.66  CALCIUM 9.5 9.4 8.6* 9.1 8.6*  MG  --   --  1.7 1.7 1.5*   Liver Function Tests: Recent Labs  Lab 10/11/19 1200  AST 30  ALT 22  ALKPHOS 64  BILITOT 1.0  PROT 7.0  ALBUMIN 4.0   No results for input(s): LIPASE, AMYLASE in the last 168 hours. No results for input(s): AMMONIA in the last 168 hours. CBC: Recent Labs  Lab 10/11/19 1200 10/12/19 0534 10/13/19 0531 10/14/19 0457 10/15/19 0431  WBC 8.3 7.4 6.1 5.2 5.9  HGB 13.9 12.9 11.0* 10.5* 11.2*  HCT Unable to determine due to a cold agglutinin Unable to determine due to a cold agglutinin 30.0* 29.4* 30.9*  MCV Unable to determine due to a cold agglutinin Unable to determine due to a cold agglutinin 88.8 91.0 90.1  PLT 240 203 177 178 168   Cardiac Enzymes: No results for input(s): CKTOTAL, CKMB, CKMBINDEX, TROPONINI in the last 168 hours. BNP: Invalid input(s): POCBNP CBG: No results for input(s): GLUCAP in the last 168 hours. D-Dimer No results for input(s): DDIMER in the  last 72 hours. Hgb A1c No results for input(s): HGBA1C in the last 72 hours. Lipid Profile No results for input(s): CHOL, HDL, LDLCALC, TRIG, CHOLHDL, LDLDIRECT in the last 72 hours. Thyroid function studies No results for input(s): TSH, T4TOTAL, T3FREE, THYROIDAB in the last 72 hours.  Invalid input(s): FREET3 Anemia work up No results  for input(s): VITAMINB12, FOLATE, FERRITIN, TIBC, IRON, RETICCTPCT in the last 72 hours. Urinalysis    Component Value Date/Time   COLORURINE YELLOW (A) 10/11/2019 1200   APPEARANCEUR CLOUDY (A) 10/11/2019 1200   APPEARANCEUR Hazy 10/28/2011 1657   LABSPEC 1.013 10/11/2019 1200   LABSPEC 1.011 10/28/2011 1657   PHURINE 7.0 10/11/2019 1200   GLUCOSEU NEGATIVE 10/11/2019 1200   GLUCOSEU Negative 10/28/2011 1657   HGBUR NEGATIVE 10/11/2019 1200   BILIRUBINUR NEGATIVE 10/11/2019 1200   BILIRUBINUR Negative 10/28/2011 1657   KETONESUR NEGATIVE 10/11/2019 1200   PROTEINUR NEGATIVE 10/11/2019 1200   NITRITE NEGATIVE 10/11/2019 1200   LEUKOCYTESUR SMALL (A) 10/11/2019 1200   LEUKOCYTESUR Negative 10/28/2011 1657   Sepsis Labs Invalid input(s): PROCALCITONIN,  WBC,  LACTICIDVEN Microbiology Recent Results (from the past 240 hour(s))  SARS Coronavirus 2 by RT PCR (hospital order, performed in [facility] hospital lab) Nasopharyngeal Nasopharyngeal Swab     Status: None   Collection Time: 10/11/19  1:18 PM   Specimen: Nasopharyngeal Swab  Result Value Ref Range Status   SARS Coronavirus 2 NEGATIVE NEGATIVE Final    Comment: (NOTE) SARS-CoV-2 target nucleic acids are NOT DETECTED. The SARS-CoV-2 RNA is generally detectable in upper and lower respiratory specimens during the acute phase of infection. The lowest concentration of SARS-CoV-2 viral copies this assay can detect is 250 copies / mL. A negative result does not preclude SARS-CoV-2 infection and should not be used as the sole basis for treatment or other patient management decisions.  A  negative result may occur with improper specimen collection / handling, submission of specimen other than nasopharyngeal swab, presence of viral mutation(s) within the areas targeted by this assay, and inadequate number of viral copies (<250 copies / mL). A negative result must be combined with clinical observations, patient history, and epidemiological information. Fact Sheet for Patients:   BoilerBrush.com.cy Fact Sheet for Healthcare Providers: https://pope.com/ This test is not yet approved or cleared  by the [place] FDA and has been authorized for detection and/or diagnosis of SARS-CoV-2 by FDA under an Emergency Use Authorization (EUA).  This EUA will remain in effect (meaning this test can be used) for the duration of the COVID-19 declaration under Section 564(b)(1) of the Act, 21 U.S.C. section 360bbb-3(b)(1), unless the authorization is terminated or revoked sooner. Performed at [facility], [address] [place], [place] [address]      Time coordinating discharge: Over 30 minutes  SIGNED:   Pennie Banter, DO [facility] 10/15/2019, 1:22 PM   If 7PM-7AM, please contact night-coverage [email or web address]

## 2019-10-15 NOTE — TOC Progression Note (Signed)
Transition of Care [facility]) - Progression Note    Patient Details  Name: Dana Bryant MRN: [id number] Date of Birth: 15-Jun-1933  Transition of Care [facility]) CM/SW Contact  Maree Krabbe, LCSW Phone Number: 10/15/2019, 1:14 PM  Clinical Narrative:   CSW spoke with both pt's sons and Dr. Moreen Fowler son have talked to the pt and they think at this time it is best that pt go to SNF. CSW explained that PT recommended HH therefore it is highly unlikely that [facility] will approve. Pt's sons do not want pt to have to wait in the hospital for the approval or denial. Pt's son's state that pt can private pay. CSW got a total for pt's son --7,800$ up front for a month. It pt does not stay the entire month she will be refunded. Pt's son's are agreeable. Pt's son also called [facility] to speak to Tammy the admissions coordinator. Pt will d/c to [facility] today. MD aware.    Expected Discharge Plan: Skilled Nursing Facility Barriers to Discharge: No Barriers Identified  Expected Discharge Plan and Services Expected Discharge Plan: Skilled Nursing Facility In-house Referral: Clinical Social Work   Post Acute Care Choice: Skilled Nursing Facility Living arrangements for the past 2 months: Single Family Home                                       Social Determinants of Health (SDOH) Interventions    Readmission Risk Interventions No flowsheet data found.

## 2019-10-15 NOTE — NC FL2 (Signed)
[place] MEDICAID FL2 LEVEL OF CARE SCREENING TOOL     IDENTIFICATION  Patient Name: Dana Bryant Birthdate: [birth year] Sex: female Admission Date (Current Location): 10/11/2019  [facility] and [place] Number:  Chiropodist and Address:         Provider Number: [id number]  Attending Physician Name and Address:  Pennie Banter, DO  Relative Name and Phone Number:       Current Level of Care: Hospital Recommended Level of Care: Skilled Nursing Facility Prior Approval Number:    Date Approved/Denied:   PASRR Number: [id number]  Discharge Plan: SNF    Current Diagnoses: Patient Active Problem List   Diagnosis Date Noted  . Hyponatremia 10/11/2019  . Bradycardia 10/11/2019  . Essential hypertension 10/11/2019    Orientation RESPIRATION BLADDER Height & Weight     Self, Time, Situation, Place  Normal Continent Weight: 140 lb (63.5 kg) Height:  5\' 1"  (154.9 cm)  BEHAVIORAL SYMPTOMS/MOOD NEUROLOGICAL BOWEL NUTRITION STATUS      Continent Diet(2 gram sodium)  AMBULATORY STATUS COMMUNICATION OF NEEDS Skin   Limited Assist Verbally Normal                       Personal Care Assistance Level of Assistance  Bathing, Dressing, Feeding Bathing Assistance: Limited assistance Feeding assistance: Independent Dressing Assistance: Limited assistance     Functional Limitations Info  Sight, Hearing, Speech Sight Info: Adequate Hearing Info: Adequate Speech Info: Adequate    SPECIAL CARE FACTORS FREQUENCY  PT (By licensed PT), OT (By licensed OT)     PT Frequency: 5x OT Frequency: 5x            Contractures Contractures Info: Not present    Additional Factors Info  Code Status, Allergies Code Status Info: Full Code Allergies Info: Famciclovir, Sulfa Antibiotics           Current Medications (10/15/2019):  This is the current hospital active medication list Current Facility-Administered Medications  Medication Dose Route Frequency  Provider Last Rate Last Admin  . 0.9 %  sodium chloride infusion  250 mL Intravenous PRN Agbata, Tochukwu, MD      . 0.9 %  sodium chloride infusion   Intravenous Continuous Agbata, Tochukwu, MD 75 mL/hr at 10/15/19 0725 Rate Verify at 10/15/19 0725  . acetaminophen (TYLENOL) tablet 650 mg  650 mg Oral Q6H PRN Agbata, Tochukwu, MD       Or  . acetaminophen (TYLENOL) suppository 650 mg  650 mg Rectal Q6H PRN Agbata, Tochukwu, MD      . amLODipine (NORVASC) tablet 5 mg  5 mg Oral Daily 12/15/19 A, DO   5 mg at 10/15/19 0817  . aspirin EC tablet 81 mg  81 mg Oral Q1200 Agbata, Tochukwu, MD   81 mg at 10/15/19 0817  . atorvastatin (LIPITOR) tablet 20 mg  20 mg Oral Daily Agbata, Tochukwu, MD   20 mg at 10/15/19 0817  . calcium-vitamin D (OSCAL WITH D) 500-200 MG-UNIT per tablet 1 tablet  1 tablet Oral BID Agbata, Tochukwu, MD   1 tablet at 10/15/19 0817  . enoxaparin (LOVENOX) injection 40 mg  40 mg Subcutaneous Q24H Agbata, Tochukwu, MD   40 mg at 10/14/19 2213  . feeding supplement (ENSURE ENLIVE) (ENSURE ENLIVE) liquid 237 mL  237 mL Oral BID BM 2214, MD   237 mL at 10/15/19 0817  . magnesium sulfate IVPB 4 g 100 mL  4 g Intravenous Once 12/15/19  A, DO 50 mL/hr at 10/15/19 0955 4 g at 10/15/19 0955  . ondansetron (ZOFRAN) tablet 4 mg  4 mg Oral Q6H PRN Agbata, Tochukwu, MD       Or  . ondansetron (ZOFRAN) injection 4 mg  4 mg Intravenous Q6H PRN Agbata, Tochukwu, MD      . pantoprazole (PROTONIX) EC tablet 40 mg  40 mg Oral Daily Agbata, Tochukwu, MD   40 mg at 10/15/19 0817  . polyethylene glycol (MIRALAX / GLYCOLAX) packet 17 g  17 g Oral Daily Nicole Kindred A, DO      . sodium chloride flush (NS) 0.9 % injection 3 mL  3 mL Intravenous Once Merlyn Lot, MD      . sodium chloride flush (NS) 0.9 % injection 3 mL  3 mL Intravenous Q12H Agbata, Tochukwu, MD   3 mL at 10/12/19 0942  . sodium chloride flush (NS) 0.9 % injection 3 mL  3 mL Intravenous PRN Agbata, Tochukwu, MD          Discharge Medications: Please see discharge summary for a list of discharge medications.  Relevant Imaging Results:  Relevant Lab Results:   Additional Information XHB:[id number]  Gerrianne Scale Dakiya Puopolo, LCSW

## 2019-10-15 NOTE — Progress Notes (Signed)
Physical Therapy Treatment Patient Details Name: Dana Bryant MRN: [id number] DOB: 1933/11/04 Today's Date: 10/15/2019    History of Present Illness Dana Bryant is a 86 y.o. female with medical history significant for hypertension, depression and dyslipidemia who presents to the emergency room for evaluation of generalized weakness.  Symptoms have been ongoing for about 3 weeks when patient stated that she just felt weak and washed out but was still able to carry out her activities of daily living.  Over the last 4 days she has gotten increasingly weak and required the use of a walker for ambulation.  Patient states she fell 2 days prior to coming to the hospital just because she was very weak.    PT Comments    Pt was long sitting in bed upon arriving. She reports she recently ambulate with RN to RN station but with encouragement was willing to participate in PT. Pt was able to exit bed (flat) without physical assistance. Stood and ambulated with RW without difficulty. Ambulated 200 ft without LOB or unsteadiness. Pt's son is concerned about pt DC to home however therapist discussed PT recommendation for home with HHPT. Therapist scheduled AM session with son present. Pt was repositioned in bed at conclusion of session with RN aware of pt's abilities. Acute PT will continue to follow per POC.   Follow Up Recommendations  Home health PT     Equipment Recommendations  None recommended by PT    Recommendations for Other Services       Precautions / Restrictions Precautions Precautions: Fall Restrictions Weight Bearing Restrictions: No    Mobility  Bed Mobility Overal bed mobility: Modified Independent                Transfers Overall transfer level: Modified independent Equipment used: Rolling walker (2 wheeled) Transfers: Sit to/from Stand Sit to Stand: Supervision         General transfer comment: minimal VCs for improved technique. no physical assist  required  Ambulation/Gait Ambulation/Gait assistance: Supervision   Assistive device: Rolling walker (2 wheeled) Gait Pattern/deviations: Step-through pattern Gait velocity: decreased   General Gait Details: pt demonstrates safe steady gait kinematics without LOB or unsteadiness   Stairs             Wheelchair Mobility    Modified Rankin (Stroke Patients Only)       Balance Overall balance assessment: Modified Independent Sitting-balance support: Feet supported Sitting balance-Leahy Scale: Good     Standing balance support: Bilateral upper extremity supported;During functional activity Standing balance-Leahy Scale: Good Standing balance comment: no LOB with BUE support on RW                            Cognition Arousal/Alertness: Awake/alert Behavior During Therapy: WFL for tasks assessed/performed Overall Cognitive Status: Within Functional Limits for tasks assessed                                 General Comments: pt is slightly anxious but agrees to PT session with encouragement      Exercises      General Comments        Pertinent Vitals/Pain      Home Living                      Prior Function  PT Goals (current goals can now be found in the care plan section) Acute Rehab PT Goals Patient Stated Goal: to return home at discharge, get stronger    Frequency    Min 2X/week      PT Plan Current plan remains appropriate    Co-evaluation              AM-PAC PT "6 Clicks" Mobility   Outcome Measure  Help needed turning from your back to your side while in a flat bed without using bedrails?: A Little Help needed moving from lying on your back to sitting on the side of a flat bed without using bedrails?: A Little Help needed moving to and from a bed to a chair (including a wheelchair)?: A Little Help needed standing up from a chair using your arms (e.g., wheelchair or bedside chair)?: A  Little Help needed to walk in hospital room?: A Little Help needed climbing 3-5 steps with a railing? : A Little 6 Click Score: 18    End of Session Equipment Utilized During Treatment: Gait belt Activity Tolerance: Patient tolerated treatment well Patient left: in bed;with bed alarm set;with call bell/phone within reach;with family/visitor present Nurse Communication: Mobility status PT Visit Diagnosis: Muscle weakness (generalized) (M62.81);Difficulty in walking, not elsewhere classified (R26.2)     Time:  -     Charges:                        Jetta Lout PTA 10/15/19, 8:29 AM

## 2020-02-28 ENCOUNTER — Ambulatory Visit: Payer: Medicare HMO | Attending: Internal Medicine

## 2020-02-28 DIAGNOSIS — Z23 Encounter for immunization: Secondary | ICD-10-CM

## 2020-02-28 NOTE — Progress Notes (Signed)
   Covid-19 Vaccination Clinic  Name:  REE ALCALDE    MRN: [id number] DOB: [birth year]  02/28/2020  Ms. Summey was observed post Covid-19 immunization for 15 minutes without incident. She was provided with Vaccine Information Sheet and instruction to access the V-Safe system.   Ms. Cochrane was instructed to call 911 with any severe reactions post vaccine: Marland Kitchen Difficulty breathing  . Swelling of face and throat  . A fast heartbeat  . A bad rash all over body  . Dizziness and weakness

## 2020-06-06 ENCOUNTER — Other Ambulatory Visit: Payer: Self-pay | Admitting: Family Medicine

## 2020-06-06 DIAGNOSIS — Z1231 Encounter for screening mammogram for malignant neoplasm of breast: Secondary | ICD-10-CM

## 2020-07-04 ENCOUNTER — Ambulatory Visit
Admission: RE | Admit: 2020-07-04 | Discharge: 2020-07-04 | Disposition: A | Discharge status: Home / Self Care | Payer: Medicare HMO | Source: Ambulatory Visit | Attending: Family Medicine | Admitting: Family Medicine

## 2020-07-04 ENCOUNTER — Other Ambulatory Visit: Payer: Self-pay

## 2020-07-04 DIAGNOSIS — Z1231 Encounter for screening mammogram for malignant neoplasm of breast: Secondary | ICD-10-CM | POA: Insufficient documentation

## 2021-06-20 ENCOUNTER — Other Ambulatory Visit: Payer: Self-pay | Admitting: Family Medicine

## 2021-06-20 DIAGNOSIS — Z1231 Encounter for screening mammogram for malignant neoplasm of breast: Secondary | ICD-10-CM

## 2021-07-26 ENCOUNTER — Ambulatory Visit
Admission: RE | Admit: 2021-07-26 | Discharge: 2021-07-26 | Disposition: A | Discharge status: Home / Self Care | Payer: Medicare HMO | Source: Ambulatory Visit | Attending: Family Medicine | Admitting: Family Medicine

## 2021-07-26 DIAGNOSIS — Z1231 Encounter for screening mammogram for malignant neoplasm of breast: Secondary | ICD-10-CM | POA: Diagnosis present

## 2022-06-19 ENCOUNTER — Other Ambulatory Visit: Payer: Self-pay | Admitting: Family Medicine

## 2022-06-19 DIAGNOSIS — Z1231 Encounter for screening mammogram for malignant neoplasm of breast: Secondary | ICD-10-CM

## 2022-07-31 ENCOUNTER — Ambulatory Visit
Admission: RE | Admit: 2022-07-31 | Discharge: 2022-07-31 | Disposition: A | Discharge status: Home / Self Care | Payer: Medicare HMO | Source: Ambulatory Visit | Attending: Family Medicine | Admitting: Family Medicine

## 2022-07-31 DIAGNOSIS — Z1231 Encounter for screening mammogram for malignant neoplasm of breast: Secondary | ICD-10-CM | POA: Insufficient documentation

## 2022-08-22 IMAGING — MG MM DIGITAL SCREENING BILAT W/ TOMO AND CAD
6 of 10 series · 6 of 30 positions shown · non-contrast
Comparison: Previous exam(s).

CLINICAL DATA: Screening.

EXAM:
DIGITAL SCREENING BILATERAL MAMMOGRAM WITH TOMOSYNTHESIS AND CAD
TECHNIQUE: Bilateral screening digital craniocaudal and mediolateral oblique
mammograms were obtained. Bilateral screening digital breast
tomosynthesis was performed. The images were evaluated with
computer-aided detection.

[R MLO synth-2D (1 of 2)]
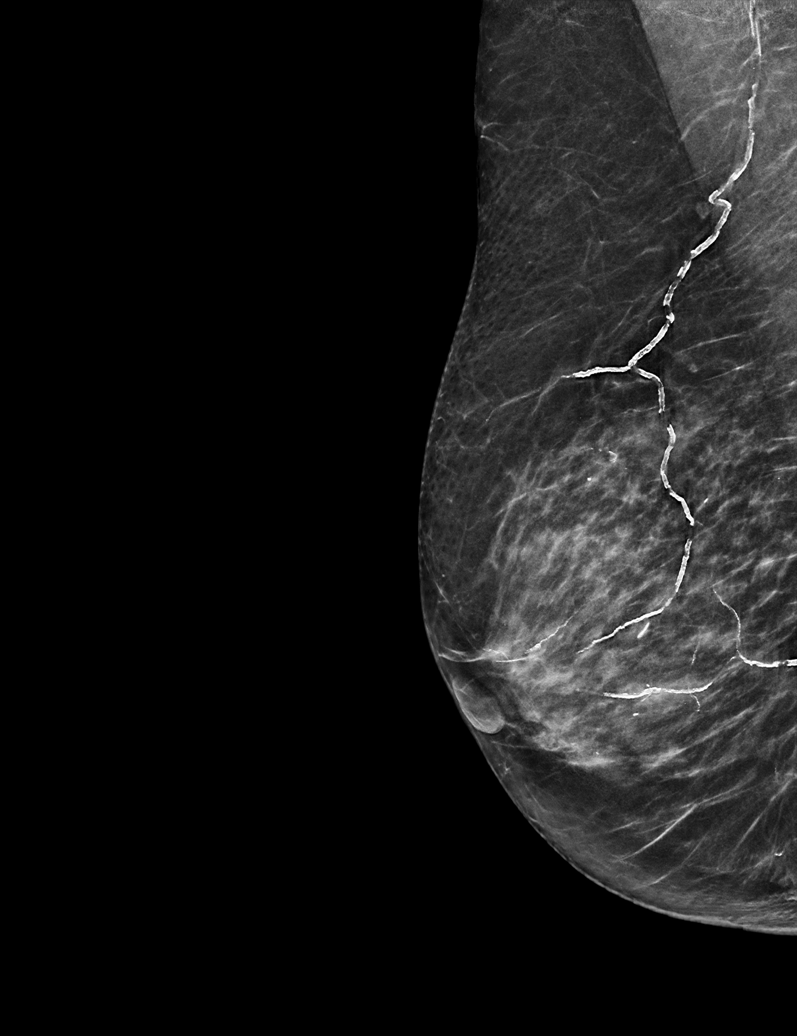

[L MLO synth-2D]
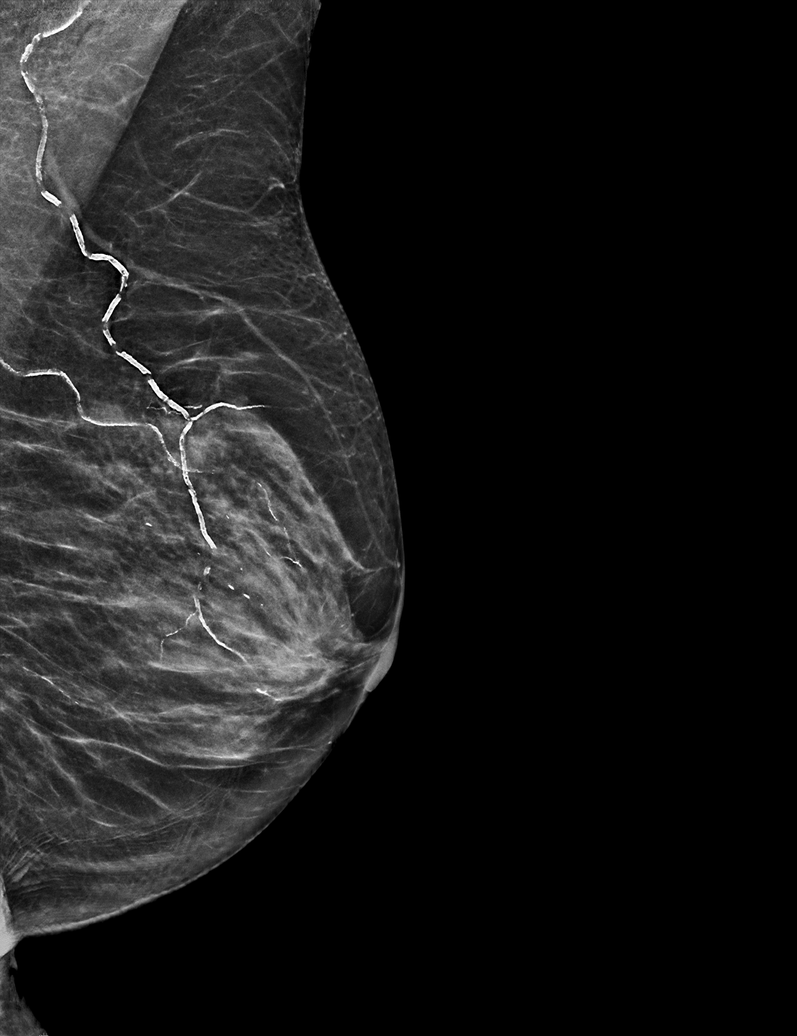

[R CC synth-2D]
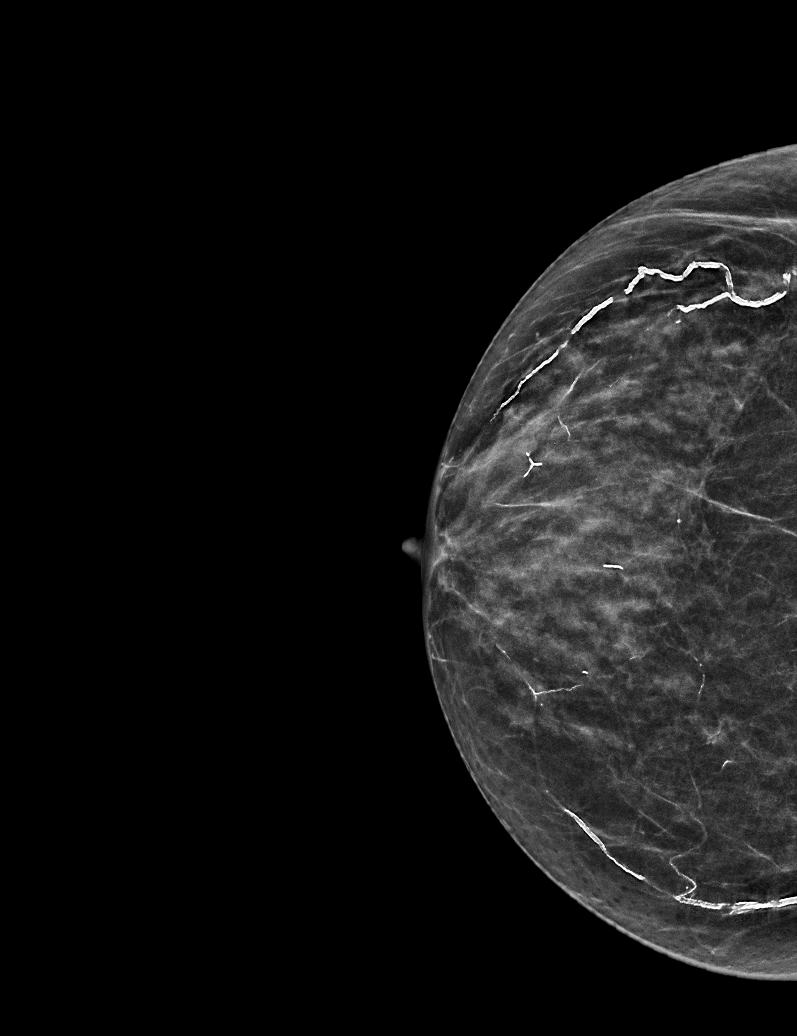

[R MLO synth-2D (2 of 2)]
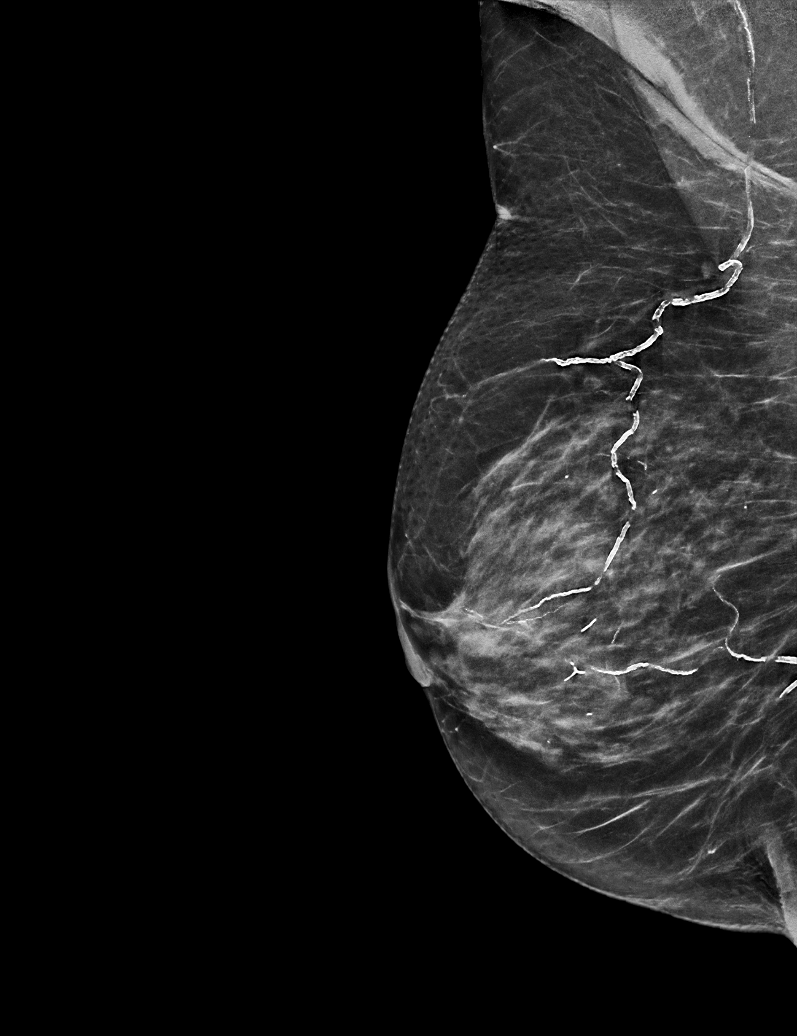

[L CC synth-2D]
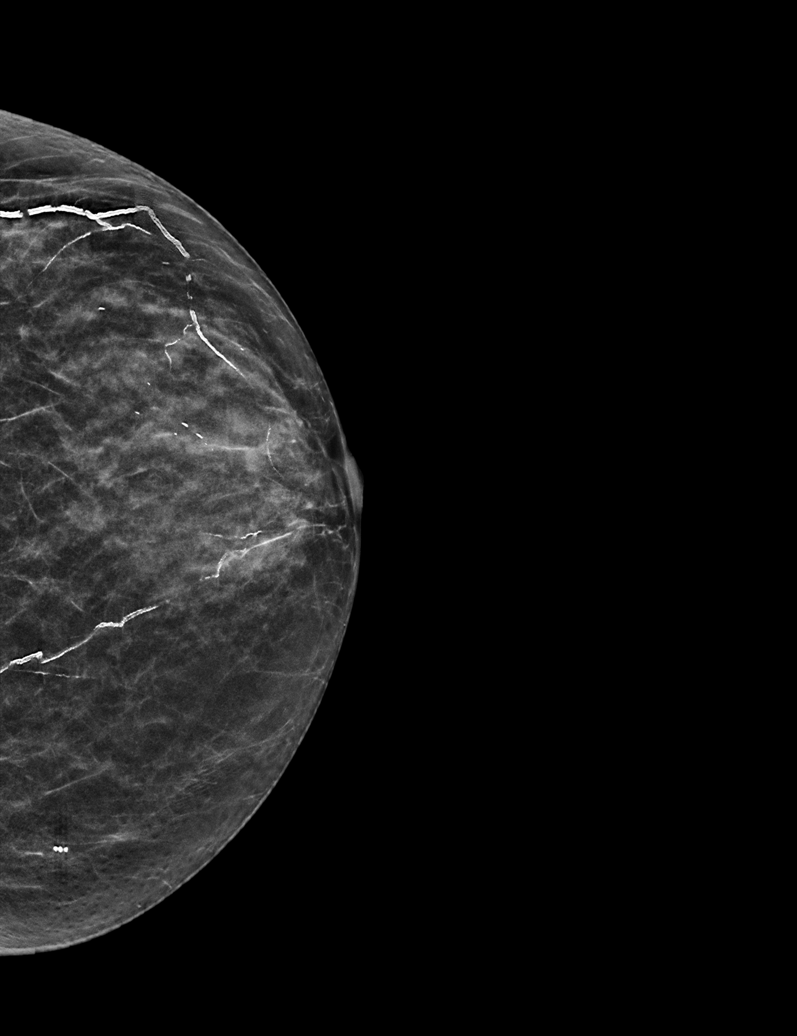

[R MLO tomo · tomo slice 28/55.0]
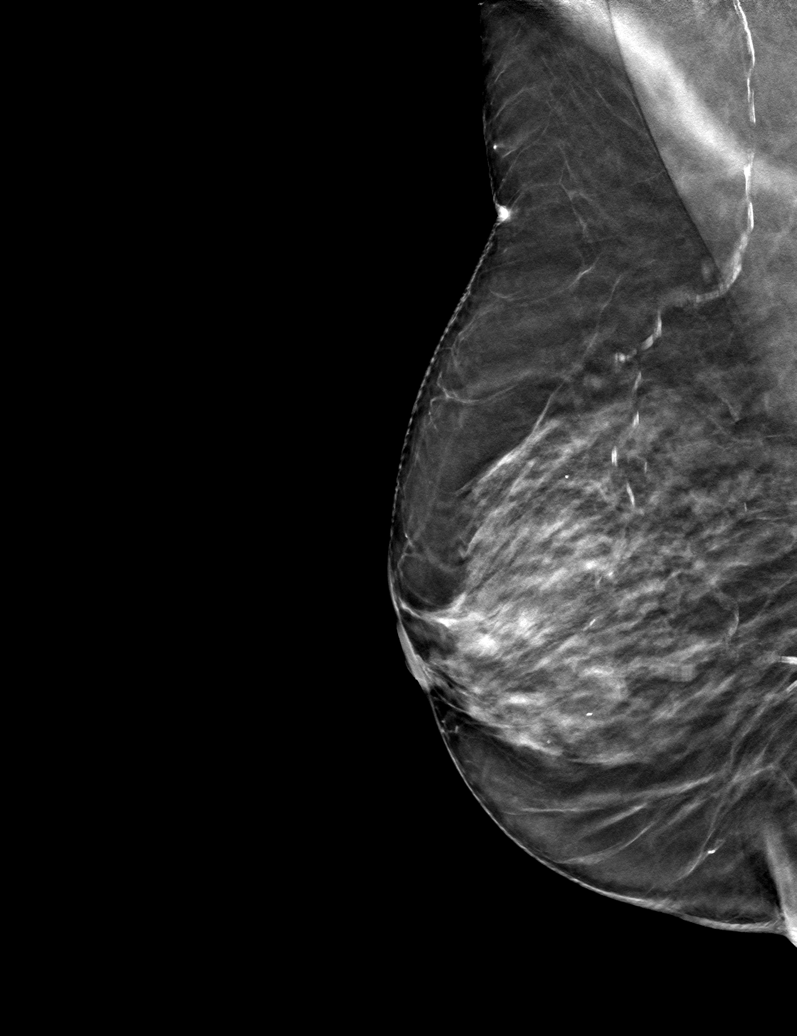

[6 of 30 positions shown; findings below may reference images not displayed]

ACR Breast Density Category c: The breast tissue is heterogeneously
dense, which may obscure small masses.
FINDINGS: There are no findings suspicious for malignancy.
IMPRESSION: No mammographic evidence of malignancy. A result letter of this
screening mammogram will be mailed directly to the patient.

RECOMMENDATION:
Screening mammogram in one year. (Code:[id number])

BI-RADS CATEGORY  1: Negative.

## 2023-06-25 ENCOUNTER — Other Ambulatory Visit: Payer: Self-pay | Admitting: Family Medicine

## 2023-06-25 DIAGNOSIS — Z1231 Encounter for screening mammogram for malignant neoplasm of breast: Secondary | ICD-10-CM

## 2023-08-13 ENCOUNTER — Ambulatory Visit
Admission: RE | Admit: 2023-08-13 | Discharge: 2023-08-13 | Disposition: A | Discharge status: Home / Self Care | Payer: Medicare HMO | Source: Ambulatory Visit | Attending: Family Medicine | Admitting: Family Medicine

## 2023-08-13 DIAGNOSIS — Z1231 Encounter for screening mammogram for malignant neoplasm of breast: Secondary | ICD-10-CM | POA: Diagnosis present

## 2023-10-20 ENCOUNTER — Other Ambulatory Visit
Admission: RE | Admit: 2023-10-20 | Discharge: 2023-10-20 | Disposition: A | Discharge status: Home / Self Care | Source: Ambulatory Visit | Attending: Emergency Medicine | Admitting: Emergency Medicine

## 2023-10-20 DIAGNOSIS — R059 Cough, unspecified: Secondary | ICD-10-CM | POA: Diagnosis present

## 2023-10-20 DIAGNOSIS — R0602 Shortness of breath: Secondary | ICD-10-CM | POA: Insufficient documentation

## 2023-10-20 DIAGNOSIS — J301 Allergic rhinitis due to pollen: Secondary | ICD-10-CM | POA: Diagnosis present

## 2023-10-20 LAB — D-DIMER, QUANTITATIVE: D-Dimer, Quant: 0.58 ug{FEU}/mL — ABNORMAL HIGH (ref 0.00–0.50)

## 2023-10-22 ENCOUNTER — Other Ambulatory Visit: Payer: Self-pay | Admitting: Emergency Medicine

## 2023-10-22 ENCOUNTER — Ambulatory Visit
Admission: RE | Admit: 2023-10-22 | Discharge: 2023-10-22 | Disposition: A | Discharge status: Home / Self Care | Source: Ambulatory Visit | Attending: Emergency Medicine | Admitting: Emergency Medicine

## 2023-10-22 DIAGNOSIS — R0602 Shortness of breath: Secondary | ICD-10-CM | POA: Insufficient documentation

## 2023-10-22 DIAGNOSIS — R7989 Other specified abnormal findings of blood chemistry: Secondary | ICD-10-CM | POA: Diagnosis present

## 2023-10-22 LAB — POCT I-STAT CREATININE: Creatinine, Ser: 0.9 mg/dL (ref 0.44–1.00)

## 2023-10-22 MED ORDER — IOHEXOL 350 MG/ML SOLN
75.0000 mL | Freq: Once | INTRAVENOUS | Status: AC | PRN
  Administered 2023-10-22: 75 mL via INTRAVENOUS

## 2023-12-03 ENCOUNTER — Encounter: Payer: Self-pay | Admitting: Pulmonary Disease

## 2023-12-03 ENCOUNTER — Other Ambulatory Visit: Payer: Self-pay | Admitting: Pulmonary Disease

## 2023-12-03 DIAGNOSIS — M7989 Other specified soft tissue disorders: Secondary | ICD-10-CM

## 2023-12-05 ENCOUNTER — Ambulatory Visit
Admission: RE | Admit: 2023-12-05 | Discharge: 2023-12-05 | Disposition: A | Discharge status: Home / Self Care | Source: Ambulatory Visit | Attending: Pulmonary Disease | Admitting: Pulmonary Disease

## 2023-12-05 DIAGNOSIS — M7989 Other specified soft tissue disorders: Secondary | ICD-10-CM | POA: Insufficient documentation
